# Patient Record
Sex: Male | Born: 1935 | Race: White | Hispanic: No | Marital: Married | Smoking: Never smoker
Health system: Southern US, Community
[De-identification: ages and names within clinical notes are randomized; demographics above are authoritative.]

## PROBLEM LIST (undated history)

## (undated) DIAGNOSIS — R0602 Shortness of breath: Secondary | ICD-10-CM

## (undated) DIAGNOSIS — I10 Essential (primary) hypertension: Secondary | ICD-10-CM

## (undated) DIAGNOSIS — R011 Cardiac murmur, unspecified: Secondary | ICD-10-CM

## (undated) HISTORY — PX: WISDOM TOOTH EXTRACTION: SHX21

## (undated) HISTORY — PX: TONSILLECTOMY: SUR1361

## (undated) HISTORY — PX: VASECTOMY: SHX75

## (undated) HISTORY — PX: EYE SURGERY: SHX253

---
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
## 2015-07-17 DIAGNOSIS — Z23 Encounter for immunization: Secondary | ICD-10-CM | POA: Diagnosis not present

## 2015-08-06 DIAGNOSIS — K5730 Diverticulosis of large intestine without perforation or abscess without bleeding: Secondary | ICD-10-CM | POA: Diagnosis not present

## 2015-08-06 DIAGNOSIS — I10 Essential (primary) hypertension: Secondary | ICD-10-CM | POA: Diagnosis not present

## 2015-08-06 DIAGNOSIS — H269 Unspecified cataract: Secondary | ICD-10-CM | POA: Diagnosis not present

## 2015-08-06 DIAGNOSIS — N401 Enlarged prostate with lower urinary tract symptoms: Secondary | ICD-10-CM | POA: Diagnosis not present

## 2015-08-06 DIAGNOSIS — R5383 Other fatigue: Secondary | ICD-10-CM | POA: Diagnosis not present

## 2015-08-06 DIAGNOSIS — E559 Vitamin D deficiency, unspecified: Secondary | ICD-10-CM | POA: Diagnosis not present

## 2015-08-06 DIAGNOSIS — E785 Hyperlipidemia, unspecified: Secondary | ICD-10-CM | POA: Diagnosis not present

## 2015-08-06 DIAGNOSIS — K649 Unspecified hemorrhoids: Secondary | ICD-10-CM | POA: Diagnosis not present

## 2015-08-06 DIAGNOSIS — Z1389 Encounter for screening for other disorder: Secondary | ICD-10-CM | POA: Diagnosis not present

## 2015-08-06 DIAGNOSIS — M545 Low back pain: Secondary | ICD-10-CM | POA: Diagnosis not present

## 2015-09-01 DIAGNOSIS — H353132 Nonexudative age-related macular degeneration, bilateral, intermediate dry stage: Secondary | ICD-10-CM | POA: Diagnosis not present

## 2015-10-16 DIAGNOSIS — M19071 Primary osteoarthritis, right ankle and foot: Secondary | ICD-10-CM | POA: Diagnosis not present

## 2015-10-16 DIAGNOSIS — R600 Localized edema: Secondary | ICD-10-CM | POA: Diagnosis not present

## 2015-10-16 DIAGNOSIS — M79671 Pain in right foot: Secondary | ICD-10-CM | POA: Diagnosis not present

## 2015-10-16 DIAGNOSIS — Z6830 Body mass index (BMI) 30.0-30.9, adult: Secondary | ICD-10-CM | POA: Diagnosis not present

## 2015-10-16 DIAGNOSIS — M7731 Calcaneal spur, right foot: Secondary | ICD-10-CM | POA: Diagnosis not present

## 2015-10-28 DIAGNOSIS — E875 Hyperkalemia: Secondary | ICD-10-CM | POA: Diagnosis not present

## 2015-10-28 DIAGNOSIS — Z0000 Encounter for general adult medical examination without abnormal findings: Secondary | ICD-10-CM | POA: Diagnosis not present

## 2015-10-28 DIAGNOSIS — I10 Essential (primary) hypertension: Secondary | ICD-10-CM | POA: Diagnosis not present

## 2015-10-28 DIAGNOSIS — E559 Vitamin D deficiency, unspecified: Secondary | ICD-10-CM | POA: Diagnosis not present

## 2015-10-28 DIAGNOSIS — E784 Other hyperlipidemia: Secondary | ICD-10-CM | POA: Diagnosis not present

## 2015-10-29 DIAGNOSIS — R69 Illness, unspecified: Secondary | ICD-10-CM | POA: Diagnosis not present

## 2015-11-03 DIAGNOSIS — M545 Low back pain: Secondary | ICD-10-CM | POA: Diagnosis not present

## 2015-11-03 DIAGNOSIS — Z0000 Encounter for general adult medical examination without abnormal findings: Secondary | ICD-10-CM | POA: Diagnosis not present

## 2015-11-03 DIAGNOSIS — M79671 Pain in right foot: Secondary | ICD-10-CM | POA: Diagnosis not present

## 2015-11-03 DIAGNOSIS — H268 Other specified cataract: Secondary | ICD-10-CM | POA: Diagnosis not present

## 2015-11-03 DIAGNOSIS — N183 Chronic kidney disease, stage 3 (moderate): Secondary | ICD-10-CM | POA: Diagnosis not present

## 2015-11-03 DIAGNOSIS — R972 Elevated prostate specific antigen [PSA]: Secondary | ICD-10-CM | POA: Diagnosis not present

## 2015-11-03 DIAGNOSIS — K5730 Diverticulosis of large intestine without perforation or abscess without bleeding: Secondary | ICD-10-CM | POA: Diagnosis not present

## 2015-11-03 DIAGNOSIS — R5383 Other fatigue: Secondary | ICD-10-CM | POA: Diagnosis not present

## 2015-11-03 DIAGNOSIS — E784 Other hyperlipidemia: Secondary | ICD-10-CM | POA: Diagnosis not present

## 2015-11-03 DIAGNOSIS — E559 Vitamin D deficiency, unspecified: Secondary | ICD-10-CM | POA: Diagnosis not present

## 2015-11-05 ENCOUNTER — Encounter: Payer: Self-pay | Admitting: Podiatry

## 2015-11-05 ENCOUNTER — Ambulatory Visit (INDEPENDENT_AMBULATORY_CARE_PROVIDER_SITE_OTHER): Payer: Medicare HMO

## 2015-11-05 ENCOUNTER — Ambulatory Visit (INDEPENDENT_AMBULATORY_CARE_PROVIDER_SITE_OTHER): Payer: Medicare HMO | Admitting: Podiatry

## 2015-11-05 DIAGNOSIS — M722 Plantar fascial fibromatosis: Secondary | ICD-10-CM | POA: Diagnosis not present

## 2015-11-05 MED ORDER — METHYLPREDNISOLONE 4 MG PO TBPK: ORAL_TABLET | ORAL | Status: DC

## 2015-11-05 MED ORDER — MELOXICAM 15 MG PO TABS: 15.0000 mg | ORAL_TABLET | Freq: Every day | ORAL | Status: DC

## 2015-11-05 NOTE — Progress Notes (Signed)
   Subjective:    Patient ID: Jonathan Becker, male    DOB: 1929/12/28, 82 y.o.   MRN: [id number]  HPI he presents today after several months of pain to the right heel. States that he has heel pain which is worse in the morning and after sitting for a while. He states when he gets back up to walk it is extremely painful. He's tried nothing to alleviate his symptoms.    Review of Systems  HENT: Positive for hearing loss.   All other systems reviewed and are negative.      Objective:   Physical Exam: Vital signs are stable he is alert and oriented 3. No acute distress. Pulses are strongly palpable. Neurologic sensorium is intact percent once the monofilament. Deep tendon reflexes intact bilateral and muscle strength is 5 over 5 dorsiflexion and plantar flexors and inverters and everters altered the musculature is intact. Orthopedic evaluation demonstrates all joints distal to the ankle have full range of motion without crepitus he has pain on palpation mucogingival the right heel. Radiographs evaluated from a disc that he brought in with him today does do a straight plantar distally oriented calcaneal heel spur with soft tissue increase in density at the plantar fascial calcaneal insertion site. Cutaneous evaluationof a well-hydrated cutis no erythema edema saline as drainage or odor.        Assessment & Plan:  Assessment: Plantar fasciitis right heel.  Plan: Injected the right heel today with Kenalog and local anesthetic. Start him on a Medrol Dosepak. Flow in the plantar fascial brace and a night splint. Discussed appropriate shoe gear stretching exercises and ice therapy will follow-up with him in 1 month.

## 2015-11-05 NOTE — Patient Instructions (Signed)

## 2015-11-06 ENCOUNTER — Telehealth: Payer: Self-pay

## 2015-11-06 NOTE — Telephone Encounter (Signed)
Spoke with pt regarding medication therapies. He had concerns with the side effects of meloxicam. He stated that his foot was feeling much better and that he intended to complete the prednisone as directed.Advised pt the meloxicam could be an option after completing the prednisone if he felt comfortable taking it. Continue with other therapies as directed by Dr Milinda Pointer. Pt is to call with any other questions or concerns

## 2015-11-19 DIAGNOSIS — D1801 Hemangioma of skin and subcutaneous tissue: Secondary | ICD-10-CM | POA: Diagnosis not present

## 2015-11-19 DIAGNOSIS — D225 Melanocytic nevi of trunk: Secondary | ICD-10-CM | POA: Diagnosis not present

## 2015-11-19 DIAGNOSIS — L820 Inflamed seborrheic keratosis: Secondary | ICD-10-CM | POA: Diagnosis not present

## 2015-11-19 DIAGNOSIS — L570 Actinic keratosis: Secondary | ICD-10-CM | POA: Diagnosis not present

## 2015-11-19 DIAGNOSIS — L821 Other seborrheic keratosis: Secondary | ICD-10-CM | POA: Diagnosis not present

## 2015-11-19 DIAGNOSIS — L814 Other melanin hyperpigmentation: Secondary | ICD-10-CM | POA: Diagnosis not present

## 2015-11-25 DIAGNOSIS — H25812 Combined forms of age-related cataract, left eye: Secondary | ICD-10-CM | POA: Diagnosis not present

## 2015-11-25 DIAGNOSIS — H2512 Age-related nuclear cataract, left eye: Secondary | ICD-10-CM | POA: Diagnosis not present

## 2015-12-03 ENCOUNTER — Encounter: Payer: Self-pay | Admitting: Podiatry

## 2015-12-03 ENCOUNTER — Ambulatory Visit (INDEPENDENT_AMBULATORY_CARE_PROVIDER_SITE_OTHER): Payer: Medicare HMO | Admitting: Podiatry

## 2015-12-03 VITALS — BP 150/75 | HR 64 | Resp 16

## 2015-12-03 DIAGNOSIS — M722 Plantar fascial fibromatosis: Secondary | ICD-10-CM | POA: Diagnosis not present

## 2015-12-03 NOTE — Progress Notes (Signed)
He presents today for follow-up of plantar fasciitis to the right foot. He states that the right heel is doing much better to the instep is a little bit sore.  Objective: Vital signs are stable he is alert and oriented 3. Pulses are strongly palpable. He has minimal pain on palpation medial calcaneal tubercle.  Assessment: Well-healing plantar fasciitis.  Plan: I encouraged him to continue all conservative therapies including anti-inflammatories myofascial brace and night splint. We discussed appropriate shoe via stretching exercises and ice therapy and continuation of his new shoes. Follow up with him in 1 month if necessary.

## 2015-12-16 DIAGNOSIS — H2511 Age-related nuclear cataract, right eye: Secondary | ICD-10-CM | POA: Diagnosis not present

## 2015-12-16 DIAGNOSIS — H25811 Combined forms of age-related cataract, right eye: Secondary | ICD-10-CM | POA: Diagnosis not present

## 2016-01-22 DIAGNOSIS — Z460 Encounter for fitting and adjustment of spectacles and contact lenses: Secondary | ICD-10-CM | POA: Diagnosis not present

## 2016-03-11 DIAGNOSIS — L720 Epidermal cyst: Secondary | ICD-10-CM | POA: Diagnosis not present

## 2016-03-11 DIAGNOSIS — L820 Inflamed seborrheic keratosis: Secondary | ICD-10-CM | POA: Diagnosis not present

## 2016-03-11 DIAGNOSIS — L728 Other follicular cysts of the skin and subcutaneous tissue: Secondary | ICD-10-CM | POA: Diagnosis not present

## 2016-03-11 DIAGNOSIS — L821 Other seborrheic keratosis: Secondary | ICD-10-CM | POA: Diagnosis not present

## 2016-05-03 DIAGNOSIS — R69 Illness, unspecified: Secondary | ICD-10-CM | POA: Diagnosis not present

## 2016-05-05 DIAGNOSIS — I129 Hypertensive chronic kidney disease with stage 1 through stage 4 chronic kidney disease, or unspecified chronic kidney disease: Secondary | ICD-10-CM | POA: Diagnosis not present

## 2016-05-05 DIAGNOSIS — R972 Elevated prostate specific antigen [PSA]: Secondary | ICD-10-CM | POA: Diagnosis not present

## 2016-05-05 DIAGNOSIS — Z6828 Body mass index (BMI) 28.0-28.9, adult: Secondary | ICD-10-CM | POA: Diagnosis not present

## 2016-05-05 DIAGNOSIS — N183 Chronic kidney disease, stage 3 (moderate): Secondary | ICD-10-CM | POA: Diagnosis not present

## 2016-06-24 DIAGNOSIS — Z23 Encounter for immunization: Secondary | ICD-10-CM | POA: Diagnosis not present

## 2016-07-06 DIAGNOSIS — R972 Elevated prostate specific antigen [PSA]: Secondary | ICD-10-CM | POA: Diagnosis not present

## 2016-07-26 DIAGNOSIS — Z961 Presence of intraocular lens: Secondary | ICD-10-CM | POA: Diagnosis not present

## 2016-09-08 DIAGNOSIS — L218 Other seborrheic dermatitis: Secondary | ICD-10-CM | POA: Diagnosis not present

## 2016-09-08 DIAGNOSIS — L821 Other seborrheic keratosis: Secondary | ICD-10-CM | POA: Diagnosis not present

## 2016-09-08 DIAGNOSIS — D225 Melanocytic nevi of trunk: Secondary | ICD-10-CM | POA: Diagnosis not present

## 2016-09-08 DIAGNOSIS — L814 Other melanin hyperpigmentation: Secondary | ICD-10-CM | POA: Diagnosis not present

## 2016-09-08 DIAGNOSIS — D1801 Hemangioma of skin and subcutaneous tissue: Secondary | ICD-10-CM | POA: Diagnosis not present

## 2016-09-08 DIAGNOSIS — L820 Inflamed seborrheic keratosis: Secondary | ICD-10-CM | POA: Diagnosis not present

## 2016-10-28 DIAGNOSIS — Z125 Encounter for screening for malignant neoplasm of prostate: Secondary | ICD-10-CM | POA: Diagnosis not present

## 2016-10-28 DIAGNOSIS — E559 Vitamin D deficiency, unspecified: Secondary | ICD-10-CM | POA: Diagnosis not present

## 2016-10-28 DIAGNOSIS — I10 Essential (primary) hypertension: Secondary | ICD-10-CM | POA: Diagnosis not present

## 2016-10-28 DIAGNOSIS — E784 Other hyperlipidemia: Secondary | ICD-10-CM | POA: Diagnosis not present

## 2016-11-04 DIAGNOSIS — I129 Hypertensive chronic kidney disease with stage 1 through stage 4 chronic kidney disease, or unspecified chronic kidney disease: Secondary | ICD-10-CM | POA: Diagnosis not present

## 2016-11-04 DIAGNOSIS — K5730 Diverticulosis of large intestine without perforation or abscess without bleeding: Secondary | ICD-10-CM | POA: Diagnosis not present

## 2016-11-04 DIAGNOSIS — M545 Low back pain: Secondary | ICD-10-CM | POA: Diagnosis not present

## 2016-11-04 DIAGNOSIS — Z0000 Encounter for general adult medical examination without abnormal findings: Secondary | ICD-10-CM | POA: Diagnosis not present

## 2016-11-04 DIAGNOSIS — E559 Vitamin D deficiency, unspecified: Secondary | ICD-10-CM | POA: Diagnosis not present

## 2016-11-04 DIAGNOSIS — E784 Other hyperlipidemia: Secondary | ICD-10-CM | POA: Diagnosis not present

## 2016-11-04 DIAGNOSIS — N401 Enlarged prostate with lower urinary tract symptoms: Secondary | ICD-10-CM | POA: Diagnosis not present

## 2016-11-04 DIAGNOSIS — R972 Elevated prostate specific antigen [PSA]: Secondary | ICD-10-CM | POA: Diagnosis not present

## 2016-11-04 DIAGNOSIS — K649 Unspecified hemorrhoids: Secondary | ICD-10-CM | POA: Diagnosis not present

## 2016-11-04 DIAGNOSIS — N183 Chronic kidney disease, stage 3 (moderate): Secondary | ICD-10-CM | POA: Diagnosis not present

## 2016-11-08 DIAGNOSIS — R69 Illness, unspecified: Secondary | ICD-10-CM | POA: Diagnosis not present

## 2016-11-16 DIAGNOSIS — Z1212 Encounter for screening for malignant neoplasm of rectum: Secondary | ICD-10-CM | POA: Diagnosis not present

## 2017-01-02 DIAGNOSIS — R69 Illness, unspecified: Secondary | ICD-10-CM | POA: Diagnosis not present

## 2017-01-27 DIAGNOSIS — Z961 Presence of intraocular lens: Secondary | ICD-10-CM | POA: Diagnosis not present

## 2017-01-27 DIAGNOSIS — H353132 Nonexudative age-related macular degeneration, bilateral, intermediate dry stage: Secondary | ICD-10-CM | POA: Diagnosis not present

## 2017-02-06 DIAGNOSIS — R972 Elevated prostate specific antigen [PSA]: Secondary | ICD-10-CM | POA: Diagnosis not present

## 2017-05-09 DIAGNOSIS — R69 Illness, unspecified: Secondary | ICD-10-CM | POA: Diagnosis not present

## 2017-06-06 DIAGNOSIS — D692 Other nonthrombocytopenic purpura: Secondary | ICD-10-CM | POA: Diagnosis not present

## 2017-06-06 DIAGNOSIS — I129 Hypertensive chronic kidney disease with stage 1 through stage 4 chronic kidney disease, or unspecified chronic kidney disease: Secondary | ICD-10-CM | POA: Diagnosis not present

## 2017-06-06 DIAGNOSIS — R972 Elevated prostate specific antigen [PSA]: Secondary | ICD-10-CM | POA: Diagnosis not present

## 2017-06-06 DIAGNOSIS — Z6829 Body mass index (BMI) 29.0-29.9, adult: Secondary | ICD-10-CM | POA: Diagnosis not present

## 2017-06-06 DIAGNOSIS — E784 Other hyperlipidemia: Secondary | ICD-10-CM | POA: Diagnosis not present

## 2017-06-06 DIAGNOSIS — H3530 Unspecified macular degeneration: Secondary | ICD-10-CM | POA: Diagnosis not present

## 2017-06-06 DIAGNOSIS — N183 Chronic kidney disease, stage 3 (moderate): Secondary | ICD-10-CM | POA: Diagnosis not present

## 2017-07-10 DIAGNOSIS — R69 Illness, unspecified: Secondary | ICD-10-CM | POA: Diagnosis not present

## 2017-07-31 DIAGNOSIS — R972 Elevated prostate specific antigen [PSA]: Secondary | ICD-10-CM | POA: Diagnosis not present

## 2017-08-07 DIAGNOSIS — R972 Elevated prostate specific antigen [PSA]: Secondary | ICD-10-CM | POA: Diagnosis not present

## 2017-08-08 DIAGNOSIS — L570 Actinic keratosis: Secondary | ICD-10-CM | POA: Diagnosis not present

## 2017-08-08 DIAGNOSIS — D1801 Hemangioma of skin and subcutaneous tissue: Secondary | ICD-10-CM | POA: Diagnosis not present

## 2017-08-08 DIAGNOSIS — D692 Other nonthrombocytopenic purpura: Secondary | ICD-10-CM | POA: Diagnosis not present

## 2017-08-08 DIAGNOSIS — D225 Melanocytic nevi of trunk: Secondary | ICD-10-CM | POA: Diagnosis not present

## 2017-08-08 DIAGNOSIS — L218 Other seborrheic dermatitis: Secondary | ICD-10-CM | POA: Diagnosis not present

## 2017-08-08 DIAGNOSIS — D2262 Melanocytic nevi of left upper limb, including shoulder: Secondary | ICD-10-CM | POA: Diagnosis not present

## 2017-08-08 DIAGNOSIS — L820 Inflamed seborrheic keratosis: Secondary | ICD-10-CM | POA: Diagnosis not present

## 2017-08-08 DIAGNOSIS — L821 Other seborrheic keratosis: Secondary | ICD-10-CM | POA: Diagnosis not present

## 2017-10-30 DIAGNOSIS — E559 Vitamin D deficiency, unspecified: Secondary | ICD-10-CM | POA: Diagnosis not present

## 2017-10-30 DIAGNOSIS — R82998 Other abnormal findings in urine: Secondary | ICD-10-CM | POA: Diagnosis not present

## 2017-10-30 DIAGNOSIS — Z125 Encounter for screening for malignant neoplasm of prostate: Secondary | ICD-10-CM | POA: Diagnosis not present

## 2017-10-30 DIAGNOSIS — I10 Essential (primary) hypertension: Secondary | ICD-10-CM | POA: Diagnosis not present

## 2017-11-06 DIAGNOSIS — E7849 Other hyperlipidemia: Secondary | ICD-10-CM | POA: Diagnosis not present

## 2017-11-06 DIAGNOSIS — R972 Elevated prostate specific antigen [PSA]: Secondary | ICD-10-CM | POA: Diagnosis not present

## 2017-11-06 DIAGNOSIS — N183 Chronic kidney disease, stage 3 (moderate): Secondary | ICD-10-CM | POA: Diagnosis not present

## 2017-11-06 DIAGNOSIS — H3530 Unspecified macular degeneration: Secondary | ICD-10-CM | POA: Diagnosis not present

## 2017-11-06 DIAGNOSIS — R001 Bradycardia, unspecified: Secondary | ICD-10-CM | POA: Diagnosis not present

## 2017-11-06 DIAGNOSIS — E559 Vitamin D deficiency, unspecified: Secondary | ICD-10-CM | POA: Diagnosis not present

## 2017-11-06 DIAGNOSIS — I129 Hypertensive chronic kidney disease with stage 1 through stage 4 chronic kidney disease, or unspecified chronic kidney disease: Secondary | ICD-10-CM | POA: Diagnosis not present

## 2017-11-06 DIAGNOSIS — K429 Umbilical hernia without obstruction or gangrene: Secondary | ICD-10-CM | POA: Diagnosis not present

## 2017-11-06 DIAGNOSIS — M545 Low back pain: Secondary | ICD-10-CM | POA: Diagnosis not present

## 2017-11-06 DIAGNOSIS — Z0000 Encounter for general adult medical examination without abnormal findings: Secondary | ICD-10-CM | POA: Diagnosis not present

## 2017-11-13 DIAGNOSIS — Z1212 Encounter for screening for malignant neoplasm of rectum: Secondary | ICD-10-CM | POA: Diagnosis not present

## 2017-11-14 DIAGNOSIS — R69 Illness, unspecified: Secondary | ICD-10-CM | POA: Diagnosis not present

## 2017-11-17 ENCOUNTER — Encounter (HOSPITAL_COMMUNITY): Payer: Self-pay | Admitting: *Deleted

## 2017-11-17 ENCOUNTER — Other Ambulatory Visit: Payer: Self-pay

## 2017-11-17 ENCOUNTER — Ambulatory Visit: Payer: Self-pay | Admitting: Surgery

## 2017-11-17 DIAGNOSIS — K429 Umbilical hernia without obstruction or gangrene: Secondary | ICD-10-CM | POA: Diagnosis not present

## 2017-11-17 NOTE — H&P (Signed)
Jonathan Becker Documented: 11/17/2017 2:04 PM Location: [facility] Patient #: [id number] DOB: Dec 08, 1929 Married / Language: Cleophus Molt / Race: White Male   History of Present Illness (Kenzley Ke A. Kae Heller MD; 11/17/2017 2:32 PM) Patient words: This is a very pleasant and relatively healthy 84 year old gentleman who noticed a bulge at his umbilicus a few months ago. He does have a pinching type pain which is worse after eating meals but other than that it does not really bother him. It is reducible. He's never had any prior abdominal surgery. He is interested in having the hernia repaired. No nausea or vomiting, no abdominal distention or constipation. He and his wife of 50yrs are going on a trip starting in the middle of March where they'll be gone for 4-5 weeks on a cruise from [place] thru [place] to [place], and he is hoping to have surgery done early enough that he will not have to worry about lifting his luggage.  H.o HTN, CKD III, macular degeneration, vit d deficiency, chronic back pain, sinus bradycardia, BPH. He has not been in a hospital since having his tonsils out at age 35.  Past Surgical History (Tanisha A. Owens Shark, [place]; 11/17/2017 2:06 PM) Cataract Surgery  Bilateral. Colon Polyp Removal - Colonoscopy  Oral Surgery  Tonsillectomy  Vasectomy   Diagnostic Studies History (Tanisha A. Owens Shark, [place]; 11/17/2017 2:06 PM) Colonoscopy  within last year  Allergies (Tanisha A. Owens Shark, [place]; 11/17/2017 2:08 PM) Sulfa Antibiotics  Clindamycin  Allergies Reconciled   Medication History (Tanisha A. Owens Shark, [place]; 11/17/2017 2:08 PM) AmLODIPine Besylate (10MG  Tablet, Oral) Active. Irbesartan (150MG  Tablet, Oral) Active. Aspirin (81MG  Tablet, Oral) Active. Medications Reconciled  Social History (Tanisha A. Owens Shark, [place]; 11/17/2017 2:07 PM) Alcohol use  Moderate alcohol use. Caffeine use  Coffee. No drug use  Tobacco use  Former smoker.  Family History  (Tanisha A. Owens Shark, [place]; 11/17/2017 2:07 PM) Diabetes Mellitus  Father. Heart Disease  Father. Seizure disorder  Sister.  Other Problems (Tanisha A. Owens Shark, [place]; 11/17/2017 2:07 PM) Back Pain  Enlarged Prostate  High blood pressure     Review of Systems (Tanisha A. Brown RMA; 11/17/2017 2:07 PM) General Not Present- Appetite Loss, Chills, Fatigue, Fever, Night Sweats, Weight Gain and Weight Loss. Skin Not Present- Change in Wart/Mole, Dryness, Hives, Jaundice, New Lesions, Non-Healing Wounds, Rash and Ulcer. HEENT Present- Hearing Loss and Wears glasses/contact lenses. Not Present- Earache, Hoarseness, Nose Bleed, Oral Ulcers, Ringing in the Ears, Seasonal Allergies, Sinus Pain, Sore Throat, Visual Disturbances and Yellow Eyes. Respiratory Not Present- Bloody sputum, Chronic Cough, Difficulty Breathing, Snoring and Wheezing. Breast Not Present- Breast Mass, Breast Pain, Nipple Discharge and Skin Changes. Cardiovascular Not Present- Chest Pain, Difficulty Breathing Lying Down, Leg Cramps, Palpitations, Rapid Heart Rate, Shortness of Breath and Swelling of Extremities. Gastrointestinal Present- Abdominal Pain. Not Present- Bloating, Bloody Stool, Change in Bowel Habits, Chronic diarrhea, Constipation, Difficulty Swallowing, Excessive gas, Gets full quickly at meals, Hemorrhoids, Indigestion, Nausea, Rectal Pain and Vomiting. Male Genitourinary Present- Urgency and Urine Leakage. Not Present- Blood in Urine, Change in Urinary Stream, Frequency, Impotence, Nocturia and Painful Urination. Musculoskeletal Present- Back Pain. Not Present- Joint Pain, Joint Stiffness, Muscle Pain, Muscle Weakness and Swelling of Extremities. Neurological Not Present- Decreased Memory, Fainting, Headaches, Numbness, Seizures, Tingling, Tremor, Trouble walking and Weakness. Psychiatric Not Present- Anxiety, Bipolar, Change in Sleep Pattern, Depression, Fearful and Frequent crying. Endocrine Not Present- Cold Intolerance,  Excessive Hunger, Hair Changes, Heat Intolerance, Hot flashes and New Diabetes. Hematology Not  Present- Blood Thinners, Easy Bruising, Excessive bleeding, Gland problems, HIV and Persistent Infections.  Vitals (Tanisha A. Brown RMA; 11/17/2017 2:08 PM) 11/17/2017 2:07 PM Weight: 195.6 lb Height: 69in Body Surface Area: 2.05 m Body Mass Index: 28.88 kg/m  Temp.: 98.85F  Pulse: 96 (Regular)  BP: 132/84 (Sitting, Left Arm, Standard)       Physical Exam (Jatinder Mcdonagh A. Kae Heller MD; 11/17/2017 2:33 PM) General Note: alert and well appearing   Integumentary Note: warm and dry   Head and Neck Note: no mass or thyromegaly   Eye Note: No scleral icterus. Extra ocular motions intact.   ENMT Note: Moist mucous membranes, dentition intact   Chest and Lung Exam Note: Unlabored respirations, clear bilaterally   Cardiovascular Note: Regular rate and rhythm, no pedal edema   Abdomen Note: Soft, nontender nondistended. Reducible umbilical hernia, fascial defect is not easily palpable. There is a broad midline diastases in which the hernia sits. No mass or organomegaly.   Neurologic Note: Grossly intact, normal gait   Neuropsychiatric Note: Normal mood and affect. Appropriate insight.   Musculoskeletal Note: Strength symmetrical throughout, no deformity     Assessment & Plan (Kesi Perrow A. Kentarius Partington MD; 06/15/7590 6:38 PM) UMBILICAL HERNIA (G66.5) Story: I recommend open repair possibly with use of mesh depending on the size of the defect at the time of surgery. We discussed the technique of the operation as well as the typical postoperative recovery including no heavy lifting above 20 pounds for about 6 weeks. Discussed risk of bleeding, infection, pain, scarring, injury to intra-abdominal structures and hernia recurrence. We also discussed the general risks given his age of undergoing anesthesia including heart attack, pneumonia, blood clots, stroke and death.  Questions were welcomed and answered. We will try to get him scheduled as soon as possible.  Signed electronically by Clovis Riley, MD (11/17/2017 2:34 PM)

## 2017-11-17 NOTE — H&P (View-Only) (Signed)
Jonathan Becker Documented: 11/17/2017 2:04 PM Location: [facility] Patient #: [id number] DOB: 08-28-1930 Married / Language: Cleophus Molt / Race: White Male   History of Present Illness (Kalab Camps A. Kae Heller MD; 11/17/2017 2:32 PM) Patient words: This is a very pleasant and relatively healthy 84 year old gentleman who noticed a bulge at his umbilicus a few months ago. He does have a pinching type pain which is worse after eating meals but other than that it does not really bother him. It is reducible. He's never had any prior abdominal surgery. He is interested in having the hernia repaired. No nausea or vomiting, no abdominal distention or constipation. He and his wife of 78yrs are going on a trip starting in the middle of March where they'll be gone for 4-5 weeks on a cruise from [place] to [place], and he is hoping to have surgery done early enough that he will not have to worry about lifting his luggage.  H.o HTN, CKD III, macular degeneration, vit d deficiency, chronic back pain, sinus bradycardia, BPH. He has not been in a hospital since having his tonsils out at age 68.  Past Surgical History (Tanisha A. Owens Shark, [place]; 11/17/2017 2:06 PM) Cataract Surgery  Bilateral. Colon Polyp Removal - Colonoscopy  Oral Surgery  Tonsillectomy  Vasectomy   Diagnostic Studies History (Tanisha A. Owens Shark, [place]; 11/17/2017 2:06 PM) Colonoscopy  within last year  Allergies (Tanisha A. Owens Shark, [place]; 11/17/2017 2:08 PM) Sulfa Antibiotics  Clindamycin  Allergies Reconciled   Medication History (Tanisha A. Owens Shark, [place]; 11/17/2017 2:08 PM) AmLODIPine Besylate (10MG  Tablet, Oral) Active. Irbesartan (150MG  Tablet, Oral) Active. Aspirin (81MG  Tablet, Oral) Active. Medications Reconciled  Social History (Tanisha A. Owens Shark, [place]; 11/17/2017 2:07 PM) Alcohol use  Moderate alcohol use. Caffeine use  Coffee. No drug use  Tobacco use  Former smoker.  Family History  (Tanisha A. Owens Shark, [place]; 11/17/2017 2:07 PM) Diabetes Mellitus  Father. Heart Disease  Father. Seizure disorder  Sister.  Other Problems (Tanisha A. Owens Shark, [place]; 11/17/2017 2:07 PM) Back Pain  Enlarged Prostate  High blood pressure     Review of Systems (Tanisha A. Brown RMA; 11/17/2017 2:07 PM) General Not Present- Appetite Loss, Chills, Fatigue, Fever, Night Sweats, Weight Gain and Weight Loss. Skin Not Present- Change in Wart/Mole, Dryness, Hives, Jaundice, New Lesions, Non-Healing Wounds, Rash and Ulcer. HEENT Present- Hearing Loss and Wears glasses/contact lenses. Not Present- Earache, Hoarseness, Nose Bleed, Oral Ulcers, Ringing in the Ears, Seasonal Allergies, Sinus Pain, Sore Throat, Visual Disturbances and Yellow Eyes. Respiratory Not Present- Bloody sputum, Chronic Cough, Difficulty Breathing, Snoring and Wheezing. Breast Not Present- Breast Mass, Breast Pain, Nipple Discharge and Skin Changes. Cardiovascular Not Present- Chest Pain, Difficulty Breathing Lying Down, Leg Cramps, Palpitations, Rapid Heart Rate, Shortness of Breath and Swelling of Extremities. Gastrointestinal Present- Abdominal Pain. Not Present- Bloating, Bloody Stool, Change in Bowel Habits, Chronic diarrhea, Constipation, Difficulty Swallowing, Excessive gas, Gets full quickly at meals, Hemorrhoids, Indigestion, Nausea, Rectal Pain and Vomiting. Male Genitourinary Present- Urgency and Urine Leakage. Not Present- Blood in Urine, Change in Urinary Stream, Frequency, Impotence, Nocturia and Painful Urination. Musculoskeletal Present- Back Pain. Not Present- Joint Pain, Joint Stiffness, Muscle Pain, Muscle Weakness and Swelling of Extremities. Neurological Not Present- Decreased Memory, Fainting, Headaches, Numbness, Seizures, Tingling, Tremor, Trouble walking and Weakness. Psychiatric Not Present- Anxiety, Bipolar, Change in Sleep Pattern, Depression, Fearful and Frequent crying. Endocrine Not Present- Cold Intolerance,  Excessive Hunger, Hair Changes, Heat Intolerance, Hot flashes and New Diabetes. Hematology Not  Present- Blood Thinners, Easy Bruising, Excessive bleeding, Gland problems, HIV and Persistent Infections.  Vitals (Tanisha A. Brown RMA; 11/17/2017 2:08 PM) 11/17/2017 2:07 PM Weight: 195.6 lb Height: 69in Body Surface Area: 2.05 m Body Mass Index: 28.88 kg/m  Temp.: 98.84F  Pulse: 96 (Regular)  BP: 132/84 (Sitting, Left Arm, Standard)       Physical Exam (Kniyah Khun A. Kae Heller MD; 11/17/2017 2:33 PM) General Note: alert and well appearing   Integumentary Note: warm and dry   Head and Neck Note: no mass or thyromegaly   Eye Note: No scleral icterus. Extra ocular motions intact.   ENMT Note: Moist mucous membranes, dentition intact   Chest and Lung Exam Note: Unlabored respirations, clear bilaterally   Cardiovascular Note: Regular rate and rhythm, no pedal edema   Abdomen Note: Soft, nontender nondistended. Reducible umbilical hernia, fascial defect is not easily palpable. There is a broad midline diastases in which the hernia sits. No mass or organomegaly.   Neurologic Note: Grossly intact, normal gait   Neuropsychiatric Note: Normal mood and affect. Appropriate insight.   Musculoskeletal Note: Strength symmetrical throughout, no deformity     Assessment & Plan (Hershey Knauer A. Karsynn Deweese MD; 05/13/4195 2:22 PM) UMBILICAL HERNIA (L79.8) Story: I recommend open repair possibly with use of mesh depending on the size of the defect at the time of surgery. We discussed the technique of the operation as well as the typical postoperative recovery including no heavy lifting above 20 pounds for about 6 weeks. Discussed risk of bleeding, infection, pain, scarring, injury to intra-abdominal structures and hernia recurrence. We also discussed the general risks given his age of undergoing anesthesia including heart attack, pneumonia, blood clots, stroke and death.  Questions were welcomed and answered. We will try to get him scheduled as soon as possible.  Signed electronically by Clovis Riley, MD (11/17/2017 2:34 PM)

## 2017-11-20 ENCOUNTER — Ambulatory Visit (HOSPITAL_COMMUNITY): Payer: Medicare HMO | Admitting: Certified Registered Nurse Anesthetist

## 2017-11-20 ENCOUNTER — Ambulatory Visit (HOSPITAL_COMMUNITY)
Admission: RE | Admit: 2017-11-20 | Discharge: 2017-11-20 | Disposition: A | Discharge status: Home / Self Care | Payer: Medicare HMO | Source: Ambulatory Visit | Attending: Surgery | Admitting: Surgery

## 2017-11-20 ENCOUNTER — Encounter (HOSPITAL_COMMUNITY)
Admission: RE | Disposition: A | Discharge status: Home / Self Care | Payer: Self-pay | Source: Ambulatory Visit | Attending: Surgery

## 2017-11-20 ENCOUNTER — Other Ambulatory Visit: Payer: Self-pay

## 2017-11-20 ENCOUNTER — Encounter (HOSPITAL_COMMUNITY): Payer: Self-pay | Admitting: *Deleted

## 2017-11-20 DIAGNOSIS — K429 Umbilical hernia without obstruction or gangrene: Secondary | ICD-10-CM | POA: Insufficient documentation

## 2017-11-20 HISTORY — DX: Essential (primary) hypertension: I10

## 2017-11-20 HISTORY — DX: Cardiac murmur, unspecified: R01.1

## 2017-11-20 HISTORY — PX: UMBILICAL HERNIA REPAIR: SHX196

## 2017-11-20 LAB — BASIC METABOLIC PANEL
ANION GAP: 9 (ref 5–15)
BUN: 20 mg/dL (ref 6–20)
CALCIUM: 8.8 mg/dL — AB (ref 8.9–10.3)
CHLORIDE: 106 mmol/L (ref 101–111)
CO2: 24 mmol/L (ref 22–32)
Creatinine, Ser: 0.98 mg/dL (ref 0.61–1.24)
GFR calc Af Amer: >60 mL/min (ref >60)
GFR calc non Af Amer: >60 mL/min (ref >60)
GLUCOSE: 106 mg/dL — AB (ref 65–99)
POTASSIUM: 4.1 mmol/L (ref 3.5–5.1)
Sodium: 139 mmol/L (ref 135–145)

## 2017-11-20 LAB — CBC WITH DIFFERENTIAL/PLATELET
Basophils Absolute: 0 10*3/uL (ref 0.0–0.1)
Basophils Relative: 0 %
Eosinophils Absolute: 0.2 10*3/uL (ref 0.0–0.7)
Eosinophils Relative: 3 %
HEMATOCRIT: 42.8 % (ref 39.0–52.0)
HEMOGLOBIN: 15.1 g/dL (ref 13.0–17.0)
LYMPHS ABS: 2.5 10*3/uL (ref 0.7–4.0)
LYMPHS PCT: 39 %
MCH: 33.4 pg (ref 26.0–34.0)
MCHC: 35.3 g/dL (ref 30.0–36.0)
MCV: 94.7 fL (ref 78.0–100.0)
MONOS PCT: 9 %
Monocytes Absolute: 0.6 10*3/uL (ref 0.1–1.0)
NEUTROS ABS: 3.2 10*3/uL (ref 1.7–7.7)
Neutrophils Relative %: 49 %
Platelets: 261 10*3/uL (ref 150–400)
RBC: 4.52 MIL/uL (ref 4.22–5.81)
RDW: 13.2 % (ref 11.5–15.5)
WBC: 6.5 10*3/uL (ref 4.0–10.5)

## 2017-11-20 SURGERY — REPAIR, HERNIA, UMBILICAL, ADULT
Anesthesia: General

## 2017-11-20 MED ORDER — CHLORHEXIDINE GLUCONATE 4 % EX LIQD: 60.0000 mL | Freq: Once | CUTANEOUS | Status: DC

## 2017-11-20 MED ORDER — LIDOCAINE 2% (20 MG/ML) 5 ML SYRINGE
INTRAMUSCULAR | Status: AC
  Filled 2017-11-20: qty 5

## 2017-11-20 MED ORDER — BUPIVACAINE-EPINEPHRINE 0.25% -1:200000 IJ SOLN
INTRAMUSCULAR | Status: AC
  Filled 2017-11-20: qty 1

## 2017-11-20 MED ORDER — GABAPENTIN 300 MG PO CAPS
300.0000 mg | ORAL_CAPSULE | ORAL | Status: AC
  Administered 2017-11-20: 300 mg via ORAL
  Filled 2017-11-20: qty 1

## 2017-11-20 MED ORDER — BUPIVACAINE-EPINEPHRINE 0.25% -1:200000 IJ SOLN
INTRAMUSCULAR | Status: DC | PRN
  Administered 2017-11-20: 25 mL

## 2017-11-20 MED ORDER — EPHEDRINE SULFATE 50 MG/ML IJ SOLN
INTRAMUSCULAR | Status: DC | PRN
  Administered 2017-11-20 (×2): 10 mg via INTRAVENOUS
  Administered 2017-11-20: 5 mg via INTRAVENOUS
  Administered 2017-11-20 (×2): 10 mg via INTRAVENOUS
  Administered 2017-11-20: 5 mg via INTRAVENOUS

## 2017-11-20 MED ORDER — OXYCODONE HCL 5 MG/5ML PO SOLN
5.0000 mg | Freq: Once | ORAL | Status: DC | PRN
  Filled 2017-11-20: qty 5

## 2017-11-20 MED ORDER — LACTATED RINGERS IV SOLN
INTRAVENOUS | Status: DC
  Administered 2017-11-20: 09:00:00 via INTRAVENOUS

## 2017-11-20 MED ORDER — FENTANYL CITRATE (PF) 100 MCG/2ML IJ SOLN
INTRAMUSCULAR | Status: DC | PRN
  Administered 2017-11-20 (×4): 25 ug via INTRAVENOUS

## 2017-11-20 MED ORDER — PROMETHAZINE HCL 25 MG/ML IJ SOLN: 6.2500 mg | INTRAMUSCULAR | Status: DC | PRN

## 2017-11-20 MED ORDER — HYDROCODONE-ACETAMINOPHEN 5-325 MG PO TABS: 1.0000 | ORAL_TABLET | Freq: Four times a day (QID) | ORAL | 0 refills | Status: DC | PRN

## 2017-11-20 MED ORDER — ONDANSETRON HCL 4 MG/2ML IJ SOLN
INTRAMUSCULAR | Status: AC
  Filled 2017-11-20: qty 2

## 2017-11-20 MED ORDER — LIDOCAINE 2% (20 MG/ML) 5 ML SYRINGE
INTRAMUSCULAR | Status: DC | PRN
  Administered 2017-11-20: 80 mg via INTRAVENOUS

## 2017-11-20 MED ORDER — FENTANYL CITRATE (PF) 100 MCG/2ML IJ SOLN
INTRAMUSCULAR | Status: AC
  Filled 2017-11-20: qty 2

## 2017-11-20 MED ORDER — PROPOFOL 10 MG/ML IV BOLUS
INTRAVENOUS | Status: DC | PRN
  Administered 2017-11-20: 200 mg via INTRAVENOUS

## 2017-11-20 MED ORDER — FENTANYL CITRATE (PF) 100 MCG/2ML IJ SOLN: 25.0000 ug | INTRAMUSCULAR | Status: DC | PRN

## 2017-11-20 MED ORDER — CEFAZOLIN SODIUM-DEXTROSE 2-4 GM/100ML-% IV SOLN
2.0000 g | INTRAVENOUS | Status: AC
  Administered 2017-11-20: 2 g via INTRAVENOUS
  Filled 2017-11-20: qty 100

## 2017-11-20 MED ORDER — ONDANSETRON HCL 4 MG/2ML IJ SOLN
INTRAMUSCULAR | Status: DC | PRN
  Administered 2017-11-20: 4 mg via INTRAVENOUS

## 2017-11-20 MED ORDER — ACETAMINOPHEN 500 MG PO TABS
1000.0000 mg | ORAL_TABLET | ORAL | Status: AC
  Administered 2017-11-20: 1000 mg via ORAL
  Filled 2017-11-20: qty 2

## 2017-11-20 MED ORDER — EPHEDRINE 5 MG/ML INJ
INTRAVENOUS | Status: AC
  Filled 2017-11-20: qty 10

## 2017-11-20 MED ORDER — PROPOFOL 10 MG/ML IV BOLUS
INTRAVENOUS | Status: AC
  Filled 2017-11-20: qty 20

## 2017-11-20 MED ORDER — OXYCODONE HCL 5 MG PO TABS: 5.0000 mg | ORAL_TABLET | Freq: Once | ORAL | Status: DC | PRN

## 2017-11-20 MED ORDER — 0.9 % SODIUM CHLORIDE (POUR BTL) OPTIME
TOPICAL | Status: DC | PRN
  Administered 2017-11-20: 1000 mL

## 2017-11-20 MED ORDER — DOCUSATE SODIUM 100 MG PO CAPS: 100.0000 mg | ORAL_CAPSULE | Freq: Two times a day (BID) | ORAL | 0 refills | Status: AC

## 2017-11-20 SURGICAL SUPPLY — 25 items
BENZOIN TINCTURE PRP APPL 2/3 (GAUZE/BANDAGES/DRESSINGS) IMPLANT
CHLORAPREP W/TINT 26ML (MISCELLANEOUS) ×2 IMPLANT
COVER SURGICAL LIGHT HANDLE (MISCELLANEOUS) ×2 IMPLANT
DECANTER SPIKE VIAL GLASS SM (MISCELLANEOUS) ×2 IMPLANT
DERMABOND ADVANCED (GAUZE/BANDAGES/DRESSINGS) ×1
DERMABOND ADVANCED .7 DNX12 (GAUZE/BANDAGES/DRESSINGS) ×1 IMPLANT
DRAPE LAPAROSCOPIC ABDOMINAL (DRAPES) ×2 IMPLANT
ELECT REM PT RETURN 15FT ADLT (MISCELLANEOUS) ×2 IMPLANT
GAUZE SPONGE 4X4 12PLY STRL (GAUZE/BANDAGES/DRESSINGS) IMPLANT
GLOVE BIO SURGEON STRL SZ 6 (GLOVE) ×2 IMPLANT
GLOVE INDICATOR 6.5 STRL GRN (GLOVE) ×2 IMPLANT
GOWN STRL REUS W/TWL LRG LVL3 (GOWN DISPOSABLE) ×2 IMPLANT
GOWN STRL REUS W/TWL XL LVL3 (GOWN DISPOSABLE) ×2 IMPLANT
KIT BASIN OR (CUSTOM PROCEDURE TRAY) ×2 IMPLANT
NEEDLE HYPO 22GX1.5 SAFETY (NEEDLE) ×2 IMPLANT
PACK GENERAL/GYN (CUSTOM PROCEDURE TRAY) ×2 IMPLANT
STRIP CLOSURE SKIN 1/2X4 (GAUZE/BANDAGES/DRESSINGS) IMPLANT
SUT ETHIBOND 0 MO6 C/R (SUTURE) ×2 IMPLANT
SUT MNCRL AB 4-0 PS2 18 (SUTURE) ×2 IMPLANT
SUT PROLENE 2 0 CT2 30 (SUTURE) ×4 IMPLANT
SUT VIC AB 3-0 SH 27 (SUTURE)
SUT VIC AB 3-0 SH 27XBRD (SUTURE) IMPLANT
SYR CONTROL 10ML LL (SYRINGE) ×2 IMPLANT
TOWEL OR 17X26 10 PK STRL BLUE (TOWEL DISPOSABLE) ×2 IMPLANT
TOWEL OR NON WOVEN STRL DISP B (DISPOSABLE) ×2 IMPLANT

## 2017-11-20 NOTE — Discharge Instructions (Signed)
HERNIA REPAIR: POST OP INSTRUCTIONS  ######################################################################  EAT Gradually transition to a high fiber diet with a fiber supplement over the next few weeks after discharge.  Start with a pureed / full liquid diet (see below)  WALK Walk an hour a day.  Control your pain to do that.    CONTROL PAIN Control pain so that you can walk, sleep, tolerate sneezing/coughing, go up/down stairs.  HAVE A BOWEL MOVEMENT DAILY Keep your bowels regular to avoid problems.  OK to try a laxative to override constipation.  OK to use an antidairrheal to slow down diarrhea.  Call if not better after 2 tries  CALL IF YOU HAVE PROBLEMS/CONCERNS Call if you are still struggling despite following these instructions. Call if you have concerns not answered by these instructions  ######################################################################    1. DIET: Follow a light bland diet the first 24 hours after arrival home, such as soup, liquids, crackers, etc.  Be sure to include lots of fluids daily.  Avoid fast food or heavy meals as your are more likely to get nauseated.  Eat a low fat the next few days after surgery. 2. Take your usually prescribed home medications unless otherwise directed. 3. PAIN CONTROL: a. Pain is best controlled by a usual combination of three different methods TOGETHER: i. Ice/Heat ii. Over the counter pain medication iii. Prescription pain medication b. Most patients will experience some swelling and bruising around the hernia(s) such as the bellybutton, groins, or old incisions.  Ice packs or heating pads (30-60 minutes up to 6 times a day) will help. Use ice for the first few days to help decrease swelling and bruising, then switch to heat to help relax tight/sore spots and speed recovery.  Some people prefer to use ice alone, heat alone, alternating between ice & heat.  Experiment to what works for you.  Swelling and bruising can take  several weeks to resolve.   c. It is helpful to take an over-the-counter pain medication regularly for the first few weeks.  Choose one of the following that works best for you: i. Naproxen (Aleve, etc)  Two 220mg  tabs twice a day ii. Ibuprofen (Advil, etc) Three 200mg  tabs four times a day (every meal & bedtime) iii. Acetaminophen (Tylenol, etc) 325-650mg  four times a day (every meal & bedtime) d. A  prescription for pain medication should be given to you upon discharge.  Take your pain medication as prescribed.  i. If you are having problems/concerns with the prescription medicine (does not control pain, nausea, vomiting, rash, itching, etc), please call us [phone] to see if we need to switch you to a different pain medicine that will work better for you and/or control your side effect better. ii. If you need a refill on your pain medication, please contact your pharmacy.  They will contact our office to request authorization. Prescriptions will not be filled after 5 pm or on week-ends. 4. Avoid getting constipated.  Between the surgery and the pain medications, it is common to experience some constipation.  Increasing fluid intake and taking a fiber supplement (such as Metamucil, Citrucel, FiberCon, MiraLax, etc) 1-2 times a day regularly will usually help prevent this problem from occurring.  A mild laxative (prune juice, Milk of Magnesia, MiraLax, etc) should be taken according to package directions if there are no bowel movements after 48 hours.   5. Wash / shower every day.  You may shower over the skin glue which is waterproof.  No soaking or  swimming for 2 weeks 6. Skin glue will peel off after a couple weeks.  You may leave the incision open to air.  You may replace a dressing/Band-Aid to cover the incision for comfort if you wish.  Continue to shower over incision(s) after the dressing is off.    7. ACTIVITIES as tolerated:   a. You may resume regular (light) daily activities  beginning the next day--such as daily self-care, walking, climbing stairs--gradually increasing activities as tolerated.  If you can walk 30 minutes without difficulty, it is safe to try more intense activity such as jogging, treadmill, bicycling, low-impact aerobics, swimming, etc. b. Save the most intensive and strenuous activity for last such as sit-ups, heavy lifting, contact sports, etc  Refrain from any heavy lifting or straining until 6 weeks after surgery.   c. DO NOT PUSH THROUGH PAIN.  Let pain be your guide: If it hurts to do something, don't do it.  Pain is your body warning you to avoid that activity for another week until the pain goes down. d. You may drive when you are no longer taking prescription pain medication, you can comfortably wear a seatbelt, and you can safely maneuver your car and apply brakes. e. Dennis Bast may have sexual intercourse when it is comfortable.  8. FOLLOW UP in our office a. Please call [facility] at [phone] to set up an appointment to see your surgeon in the office for a follow-up appointment approximately 2-3 weeks after your surgery. b. Make sure that you call for this appointment the day you arrive home to insure a convenient appointment time. 9.  IF YOU HAVE DISABILITY OR FAMILY LEAVE FORMS, BRING THEM TO THE OFFICE FOR PROCESSING.  DO NOT GIVE THEM TO YOUR DOCTOR.  WHEN TO CALL us [phone]: 1. Poor pain control 2. Reactions / problems with new medications (rash/itching, nausea, etc)  3. Fever over 101.5 F (38.5 C) 4. Inability to urinate 5. Nausea and/or vomiting 6. Worsening swelling or bruising 7. Continued bleeding from incision. 8. Increased pain, redness, or drainage from the incision   The clinic staff is available to answer your questions during regular business hours (8:30am-5pm).  Please dont hesitate to call and ask to speak to one of our nurses for clinical concerns.   If you have a medical emergency, go to the nearest emergency room or  call 911.  A surgeon from [facility] is always on call at the hospitals in [facility], [place], [place], [place], [place]  [address] ?  [address] [address], [place], [place]   [address] MAIN: [phone] ? TOLL FREE: [phone] ? FAX: [phone] [email or web address]

## 2017-11-20 NOTE — Anesthesia Preprocedure Evaluation (Addendum)
Anesthesia Evaluation  Patient identified by MRN, date of birth, ID band Patient awake    Reviewed: Allergy & Precautions, NPO status , Patient's Chart, lab work & pertinent test results  History of Anesthesia Complications (+) PROLONGED EMERGENCE  Airway Mallampati: II  TM Distance: >3 FB Neck ROM: Full    Dental no notable dental hx.    Pulmonary neg pulmonary ROS,    Pulmonary exam normal breath sounds clear to auscultation       Cardiovascular hypertension,  Rhythm:Regular Rate:Normal + Systolic murmurs    Neuro/Psych negative neurological ROS  negative psych ROS   GI/Hepatic negative GI ROS, Neg liver ROS,   Endo/Other  negative endocrine ROS  Renal/GU Renal InsufficiencyRenal disease  negative genitourinary   Musculoskeletal negative musculoskeletal ROS (+)   Abdominal   Peds negative pediatric ROS (+)  Hematology negative hematology ROS (+)   Anesthesia Other Findings   Reproductive/Obstetrics negative OB ROS                            Anesthesia Physical Anesthesia Plan  ASA: II  Anesthesia Plan: General   Post-op Pain Management:    Induction: Intravenous  PONV Risk Score and Plan: 2 and Ondansetron, Dexamethasone and Treatment may vary due to age or medical condition  Airway Management Planned: LMA  Additional Equipment:   Intra-op Plan:   Post-operative Plan: Extubation in OR  Informed Consent: I have reviewed the patients History and Physical, chart, labs and discussed the procedure including the risks, benefits and alternatives for the proposed anesthesia with the patient or authorized representative who has indicated his/her understanding and acceptance.   Dental advisory given  Plan Discussed with: CRNA and Surgeon  Anesthesia Plan Comments:         Anesthesia Quick Evaluation

## 2017-11-20 NOTE — Op Note (Signed)
Operative Note  Jonathan Becker  [id number]  [id number]  11/20/2017   Surgeon: Victorino Sparrow ConnorMD  Assistant: OR staff  Procedure performed: umbilical hernia repair  Preop diagnosis: umbilical hernia Post-op diagnosis/intraop findings: same  Specimens: no Retained items: no EBL: minimal cc Complications: none  Description of procedure: After obtaining informed consent the patient was taken to the operating room and placed supine on operating room table wheregeneral LMA anesthesia was initiated, preoperative antibiotics were administered, SCDs applied, and a formal timeout was performed. The abdomen was clipped, prepped and draped in usual sterile fashion. After infiltration with local, an infraumbilical incision was made sharply and the soft tissues dissected bluntly until the umbilical stalk could be encircled and divided away from the hernia sac using cautery. The sac was circumferentially dissected and intentionally kept intact. The edges of the fascia were cleared off. The defect was 1 cm in diameter. The sac was reduced into the abdomen and then the defect was closed transversely with interrupted 0 Ethibonds. Hemostasis was ensured and the wound. The umbilical skin was tacked back down to the fascia with 3-0 Vicryl and then the skin was closed with subcuticular Monocryl and Dermabond. The patient was then awakened, extubated and taken to PACU in stable condition.   All counts were correct at the completion of the case.

## 2017-11-20 NOTE — Anesthesia Procedure Notes (Signed)
Procedure Name: LMA Insertion Date/Time: 11/20/2017 10:20 AM Performed by: Maxwell Caul, CRNA Pre-anesthesia Checklist: Patient identified, Emergency Drugs available, Suction available and Patient being monitored Patient Re-evaluated:Patient Re-evaluated prior to induction Oxygen Delivery Method: Circle system utilized Preoxygenation: Pre-oxygenation with 100% oxygen Induction Type: IV induction LMA: LMA inserted LMA Size: 5.0 Number of attempts: 1 Tube secured with: Tape Dental Injury: Teeth and Oropharynx as per pre-operative assessment

## 2017-11-20 NOTE — Transfer of Care (Signed)
Immediate Anesthesia Transfer of Care Note  Patient: Jonathan Becker  Procedure(s) Performed: OPEN HERNIA REPAIR UMBILICAL ADULT (N/A )  Patient Location: PACU  Anesthesia Type:General  Level of Consciousness: awake, alert  and oriented  Airway & Oxygen Therapy: Patient Spontanous Breathing and Patient connected to face mask oxygen  Post-op Assessment: Report given to RN and Post -op Vital signs reviewed and stable  Post vital signs: Reviewed and stable  Last Vitals:  Vitals:   11/20/17 0746  BP: (!) 152/60  Pulse: (!) 50  Resp: 18  Temp: 36.7 C  SpO2: 100%    Last Pain:  Vitals:   11/20/17 0746  TempSrc: Oral         Complications: No apparent anesthesia complications

## 2017-11-20 NOTE — Interval H&P Note (Signed)
History and Physical Interval Note:  11/20/2017 9:37 AM  Jonathan Becker  has presented today for surgery, with the diagnosis of umbilical hernia  The various methods of treatment have been discussed with the patient and family. After consideration of risks, benefits and other options for treatment, the patient has consented to  Procedure(s) with comments: OPEN HERNIA REPAIR UMBILICAL ADULT (N/A) - Gen - LMA INSERTION OF MESH (N/A) as a surgical intervention .  The patient's history has been reviewed, patient examined, no change in status, stable for surgery.  I have reviewed the patient's chart and labs.  Questions were answered to the patient's satisfaction.     Pete Merten Rich Brave

## 2017-11-22 NOTE — Anesthesia Postprocedure Evaluation (Signed)
Anesthesia Post Note  Patient: Jonathan Becker  Procedure(s) Performed: OPEN HERNIA REPAIR UMBILICAL ADULT (N/A )     Anesthesia Type: General    Last Vitals:  Vitals:   11/20/17 1143 11/20/17 1151  BP: 127/61 139/63  Pulse: 62 (!) 58  Resp: 10 12  Temp: (!) 36.4 C   SpO2: 97% 96%    Last Pain:  Vitals:   11/20/17 0746  TempSrc: Oral                 Xareni Kelch S

## 2017-12-15 ENCOUNTER — Encounter: Payer: Medicare HMO | Admitting: Internal Medicine

## 2018-02-06 DIAGNOSIS — Z961 Presence of intraocular lens: Secondary | ICD-10-CM | POA: Diagnosis not present

## 2018-02-28 DIAGNOSIS — D225 Melanocytic nevi of trunk: Secondary | ICD-10-CM | POA: Diagnosis not present

## 2018-02-28 DIAGNOSIS — L218 Other seborrheic dermatitis: Secondary | ICD-10-CM | POA: Diagnosis not present

## 2018-02-28 DIAGNOSIS — L814 Other melanin hyperpigmentation: Secondary | ICD-10-CM | POA: Diagnosis not present

## 2018-02-28 DIAGNOSIS — L570 Actinic keratosis: Secondary | ICD-10-CM | POA: Diagnosis not present

## 2018-02-28 DIAGNOSIS — L821 Other seborrheic keratosis: Secondary | ICD-10-CM | POA: Diagnosis not present

## 2018-02-28 DIAGNOSIS — L720 Epidermal cyst: Secondary | ICD-10-CM | POA: Diagnosis not present

## 2018-05-14 DIAGNOSIS — K429 Umbilical hernia without obstruction or gangrene: Secondary | ICD-10-CM | POA: Diagnosis not present

## 2018-05-14 DIAGNOSIS — R972 Elevated prostate specific antigen [PSA]: Secondary | ICD-10-CM | POA: Diagnosis not present

## 2018-05-14 DIAGNOSIS — I129 Hypertensive chronic kidney disease with stage 1 through stage 4 chronic kidney disease, or unspecified chronic kidney disease: Secondary | ICD-10-CM | POA: Diagnosis not present

## 2018-05-14 DIAGNOSIS — N401 Enlarged prostate with lower urinary tract symptoms: Secondary | ICD-10-CM | POA: Diagnosis not present

## 2018-05-14 DIAGNOSIS — N183 Chronic kidney disease, stage 3 (moderate): Secondary | ICD-10-CM | POA: Diagnosis not present

## 2018-05-14 DIAGNOSIS — R001 Bradycardia, unspecified: Secondary | ICD-10-CM | POA: Diagnosis not present

## 2018-05-14 DIAGNOSIS — I10 Essential (primary) hypertension: Secondary | ICD-10-CM | POA: Diagnosis not present

## 2018-05-14 DIAGNOSIS — R5383 Other fatigue: Secondary | ICD-10-CM | POA: Diagnosis not present

## 2018-05-14 DIAGNOSIS — E559 Vitamin D deficiency, unspecified: Secondary | ICD-10-CM | POA: Diagnosis not present

## 2018-05-14 DIAGNOSIS — D692 Other nonthrombocytopenic purpura: Secondary | ICD-10-CM | POA: Diagnosis not present

## 2018-05-15 DIAGNOSIS — R69 Illness, unspecified: Secondary | ICD-10-CM | POA: Diagnosis not present

## 2018-06-23 DIAGNOSIS — R509 Fever, unspecified: Secondary | ICD-10-CM | POA: Diagnosis not present

## 2018-06-23 DIAGNOSIS — B349 Viral infection, unspecified: Secondary | ICD-10-CM | POA: Diagnosis not present

## 2018-06-25 DIAGNOSIS — D72829 Elevated white blood cell count, unspecified: Secondary | ICD-10-CM | POA: Diagnosis not present

## 2018-06-25 DIAGNOSIS — Z6829 Body mass index (BMI) 29.0-29.9, adult: Secondary | ICD-10-CM | POA: Diagnosis not present

## 2018-06-25 DIAGNOSIS — R05 Cough: Secondary | ICD-10-CM | POA: Diagnosis not present

## 2018-06-25 DIAGNOSIS — J189 Pneumonia, unspecified organism: Secondary | ICD-10-CM | POA: Diagnosis not present

## 2018-06-29 DIAGNOSIS — D72829 Elevated white blood cell count, unspecified: Secondary | ICD-10-CM | POA: Diagnosis not present

## 2018-07-02 DIAGNOSIS — D72829 Elevated white blood cell count, unspecified: Secondary | ICD-10-CM | POA: Diagnosis not present

## 2018-07-23 DIAGNOSIS — R69 Illness, unspecified: Secondary | ICD-10-CM | POA: Diagnosis not present

## 2018-07-24 DIAGNOSIS — J189 Pneumonia, unspecified organism: Secondary | ICD-10-CM | POA: Diagnosis not present

## 2018-08-01 DIAGNOSIS — R972 Elevated prostate specific antigen [PSA]: Secondary | ICD-10-CM | POA: Diagnosis not present

## 2018-08-01 DIAGNOSIS — R351 Nocturia: Secondary | ICD-10-CM | POA: Diagnosis not present

## 2018-08-30 DIAGNOSIS — L820 Inflamed seborrheic keratosis: Secondary | ICD-10-CM | POA: Diagnosis not present

## 2018-08-30 DIAGNOSIS — D3617 Benign neoplasm of peripheral nerves and autonomic nervous system of trunk, unspecified: Secondary | ICD-10-CM | POA: Diagnosis not present

## 2018-08-30 DIAGNOSIS — D1801 Hemangioma of skin and subcutaneous tissue: Secondary | ICD-10-CM | POA: Diagnosis not present

## 2018-08-30 DIAGNOSIS — L821 Other seborrheic keratosis: Secondary | ICD-10-CM | POA: Diagnosis not present

## 2018-08-30 DIAGNOSIS — L570 Actinic keratosis: Secondary | ICD-10-CM | POA: Diagnosis not present

## 2018-08-30 DIAGNOSIS — D0439 Carcinoma in situ of skin of other parts of face: Secondary | ICD-10-CM | POA: Diagnosis not present

## 2018-08-30 DIAGNOSIS — D0461 Carcinoma in situ of skin of right upper limb, including shoulder: Secondary | ICD-10-CM | POA: Diagnosis not present

## 2018-08-30 DIAGNOSIS — L814 Other melanin hyperpigmentation: Secondary | ICD-10-CM | POA: Diagnosis not present

## 2018-11-01 ENCOUNTER — Encounter: Payer: Self-pay | Admitting: Internal Medicine

## 2018-11-01 DIAGNOSIS — E559 Vitamin D deficiency, unspecified: Secondary | ICD-10-CM | POA: Diagnosis not present

## 2018-11-01 DIAGNOSIS — E7849 Other hyperlipidemia: Secondary | ICD-10-CM | POA: Diagnosis not present

## 2018-11-01 DIAGNOSIS — Z125 Encounter for screening for malignant neoplasm of prostate: Secondary | ICD-10-CM | POA: Diagnosis not present

## 2018-11-01 DIAGNOSIS — R82998 Other abnormal findings in urine: Secondary | ICD-10-CM | POA: Diagnosis not present

## 2018-11-01 DIAGNOSIS — I10 Essential (primary) hypertension: Secondary | ICD-10-CM | POA: Diagnosis not present

## 2018-11-08 DIAGNOSIS — I129 Hypertensive chronic kidney disease with stage 1 through stage 4 chronic kidney disease, or unspecified chronic kidney disease: Secondary | ICD-10-CM | POA: Diagnosis not present

## 2018-11-08 DIAGNOSIS — Z1339 Encounter for screening examination for other mental health and behavioral disorders: Secondary | ICD-10-CM | POA: Diagnosis not present

## 2018-11-08 DIAGNOSIS — H3530 Unspecified macular degeneration: Secondary | ICD-10-CM | POA: Diagnosis not present

## 2018-11-08 DIAGNOSIS — R972 Elevated prostate specific antigen [PSA]: Secondary | ICD-10-CM | POA: Diagnosis not present

## 2018-11-08 DIAGNOSIS — R0602 Shortness of breath: Secondary | ICD-10-CM | POA: Diagnosis not present

## 2018-11-08 DIAGNOSIS — K429 Umbilical hernia without obstruction or gangrene: Secondary | ICD-10-CM | POA: Diagnosis not present

## 2018-11-08 DIAGNOSIS — E7849 Other hyperlipidemia: Secondary | ICD-10-CM | POA: Diagnosis not present

## 2018-11-08 DIAGNOSIS — N183 Chronic kidney disease, stage 3 (moderate): Secondary | ICD-10-CM | POA: Diagnosis not present

## 2018-11-08 DIAGNOSIS — R0789 Other chest pain: Secondary | ICD-10-CM | POA: Diagnosis not present

## 2018-11-08 DIAGNOSIS — Z0000 Encounter for general adult medical examination without abnormal findings: Secondary | ICD-10-CM | POA: Diagnosis not present

## 2018-11-08 DIAGNOSIS — D692 Other nonthrombocytopenic purpura: Secondary | ICD-10-CM | POA: Diagnosis not present

## 2018-11-08 DIAGNOSIS — Z1331 Encounter for screening for depression: Secondary | ICD-10-CM | POA: Diagnosis not present

## 2018-11-12 DIAGNOSIS — Z1212 Encounter for screening for malignant neoplasm of rectum: Secondary | ICD-10-CM | POA: Diagnosis not present

## 2018-11-15 DIAGNOSIS — R69 Illness, unspecified: Secondary | ICD-10-CM | POA: Diagnosis not present

## 2018-12-31 ENCOUNTER — Telehealth: Payer: Self-pay

## 2018-12-31 NOTE — Telephone Encounter (Signed)
Due to COVID-19 pandemic, the patient will postpone appointment.  He understands he will be called to rearrange once precautions are lifted. He understands to call prior to that time if symptoms worsen.

## 2019-01-02 ENCOUNTER — Ambulatory Visit: Payer: Medicare HMO | Admitting: Cardiovascular Disease

## 2019-02-05 NOTE — Telephone Encounter (Signed)
The patient has been scheduled for an e-visit with Dr. Burt Knack 5/15.

## 2019-02-12 DIAGNOSIS — H52203 Unspecified astigmatism, bilateral: Secondary | ICD-10-CM | POA: Diagnosis not present

## 2019-02-12 DIAGNOSIS — H353232 Exudative age-related macular degeneration, bilateral, with inactive choroidal neovascularization: Secondary | ICD-10-CM | POA: Diagnosis not present

## 2019-02-18 ENCOUNTER — Telehealth: Payer: Self-pay | Admitting: Cardiovascular Disease

## 2019-02-18 ENCOUNTER — Other Ambulatory Visit: Payer: Self-pay

## 2019-02-18 ENCOUNTER — Encounter: Payer: Self-pay | Admitting: Cardiovascular Disease

## 2019-02-18 ENCOUNTER — Telehealth (INDEPENDENT_AMBULATORY_CARE_PROVIDER_SITE_OTHER): Payer: Medicare HMO | Admitting: Cardiovascular Disease

## 2019-02-18 DIAGNOSIS — R0602 Shortness of breath: Secondary | ICD-10-CM

## 2019-02-18 DIAGNOSIS — I10 Essential (primary) hypertension: Secondary | ICD-10-CM

## 2019-02-18 HISTORY — DX: Essential (primary) hypertension: I10

## 2019-02-18 HISTORY — DX: Shortness of breath: R06.02

## 2019-02-18 NOTE — Patient Instructions (Signed)
Medication Instructions:  Your provider recommends that you continue on your current medications as directed. Please refer to the Current Medication list given to you today.    Testing/Procedures: Your provider has requested that you have an echocardiogram. Echocardiography is a painless test that uses sound waves to create images of your heart. It provides your doctor with information about the size and shape of your heart and how well your heart's chambers and valves are working. This procedure takes approximately one hour. There are no restrictions for this procedure.    Your provider has requested that you have a lexiscan myoview. For further information please visit [email or web address]. Please follow instruction sheet, as given.  Dr. Burt Knack recommends you have a CHEST XRAY.  Follow-Up: Your provider recommends that you schedule a follow-up appointment AS NEEDED pending study results.  Any Other Special Instructions Will Be Listed Below (If Applicable).     If you need a refill on your cardiac medications before your next appointment, please call your pharmacy.

## 2019-02-18 NOTE — Progress Notes (Signed)
Virtual Visit via Telephone Note   This visit type was conducted due to national recommendations for restrictions regarding the COVID-19 Pandemic (e.g. social distancing) in an effort to limit this patient's exposure and mitigate transmission in our community.  Due to his co-morbid illnesses, this patient is at least at moderate risk for complications without adequate follow up.  This format is felt to be most appropriate for this patient at this time.  The patient did not have access to video technology/had technical difficulties with video requiring transitioning to audio format only (telephone).  All issues noted in this document were discussed and addressed.  No physical exam could be performed with this format.  Please refer to the patient's chart for his  consent to telehealth for [facility].   Date:  02/18/2019   ID:  Jonathan Becker, DOB 12-Oct-1929, MRN [id number]  Patient Location: Home Provider Location: Home  PCP:  Shon Baton, MD  Cardiologist:  No primary care provider on file.  Electrophysiologist:  None   Evaluation Performed:  New Patient Evaluation  Chief Complaint:  New Consult - Shortness of breath  History of Present Illness:    Jonathan Becker is a 85 y.o. male with shortness of breath,   He has had a very active life with hiking, skiing, and other outdoor activities. He walks every day for exercise. Despite his active lifestyle, he complains of fatigue and shortness of breath. He also complains of bendopnea. Shortness of breath has been present for about one year and more noticeable over the past 6 months. He doesn't feel like it's progressed much over that time period. Symptoms occur with 'moderate exercise.' He denies chest tightness or discomfort with activity. He denies orthopnea or PND. No leg swelling. No palpitations, lightheadedness, or syncope.   He is retired from [employer], moved away for 30 years, and returned here about 3 years ago. He  resides at [facility].  The patient does not have symptoms concerning for COVID-19 infection (fever, chills, cough, or new shortness of breath).   His BP has been well-controlled on antihypertensive medications. He has been on a statin drug in the past but stopped it secondary to myalgias. He otherwise has been in remarkably good health with no significant medical problems.    Past Medical History:  Diagnosis Date   Heart murmur    was told had slight heart murmur years ago   Hypertension    Past Surgical History:  Procedure Laterality Date   EYE SURGERY     bilateral cataract surgery with lens implant   TONSILLECTOMY     age 25   UMBILICAL HERNIA REPAIR N/A 11/20/2017   Procedure: OPEN HERNIA REPAIR UMBILICAL ADULT;  Surgeon: Clovis Riley, MD;  Location: [facility];  Service: General;  Laterality: N/A;  Gen - LMA   VASECTOMY     WISDOM TOOTH EXTRACTION       Current Meds  Medication Sig   amLODipine (NORVASC) 10 MG tablet Take 10 mg by mouth daily.    aspirin EC 81 MG tablet Take 81 mg by mouth daily.   Calcium Citrate 250 MG TABS Take 1 tablet by mouth every evening.   cholecalciferol (VITAMIN D) 1000 units tablet Take 1,000 Units by mouth daily.   hydrochlorothiazide (HYDRODIURIL) 25 MG tablet Take 25 mg by mouth daily.   irbesartan (AVAPRO) 150 MG tablet Take 150 mg by mouth daily.   magnesium gluconate (MAGONATE) 500 MG tablet Take 500 mg by mouth daily.  Multiple Vitamins-Minerals (ICAPS AREDS 2) CAPS Take 1 capsule by mouth 2 (two) times daily.   Multiple Vitamins-Minerals (MULTIVITAMIN WITH MINERALS) tablet Take 1 tablet by mouth daily.   Omega-3 1000 MG CAPS Take 1 capsule by mouth every evening.     Allergies:   Clindamycin/lincomycin and Sulfa antibiotics   Social History   Tobacco Use   Smoking status: Never Smoker   Smokeless tobacco: Never Used  Substance Use Topics   Alcohol use: Yes    Alcohol/week: 0.0 standard drinks    Comment:  1 glass liquor and 1 glass wine nightly   Drug use: No    Family Hx: Father- multiple MI's in his 47's, died at 79 Mother - died at 72 of complications related to Parkinson's Disease Sister - morbid obesity, seizure disorder No other CAD or cardiac problems in first degree relatives  ROS:   Please see the history of present illness.    All other systems reviewed and are negative.  Prior CV studies:   The following studies were reviewed today:  None on file  Labs/Other Tests and Data Reviewed:    EKG:  An ECG dated 11/20/2017 was personally reviewed today and demonstrated:  sinus bradycardia without significant ST-T changes  Recent Labs: No results found for requested labs within last 8760 hours.   Recent Lipid Panel No results found for: CHOL, TRIG, HDL, CHOLHDL, LDLCALC, LDLDIRECT  Wt Readings from Last 3 Encounters:  02/18/19 195 lb (88.5 kg)  11/20/17 191 lb 12.8 oz (87 kg)     Objective:    Vital Signs:  BP (!) 155/67 (BP Location: Left Arm, Patient Position: Sitting, Cuff Size: Normal)    Pulse (!) 52    Ht 5\' 10"  (1.778 m)    Wt 195 lb (88.5 kg)    SpO2 96%    BMI 27.98 kg/m    VITAL SIGNS:  reviewed  Remaining exam not performed because of telehealth nature of this visit. He is in no distress and breathing comfortably in normal conversation.  ASSESSMENT & PLAN:    1. Shortness of breath: No clear pulmonary risk factors with the exception of occasional smoking over a long period of time.  Patient has no cough, fever, or chills.  We will check a PA and lateral chest x-ray.  Certainly at his age with a history of hypertension, hyperlipidemia, and family history of coronary artery disease, cardiac etiologies require further evaluation.  I have recommended an echocardiogram to evaluate cardiac systolic and diastolic function as well as the presence of any valvular heart disease.  I have recommended an exercise Myoview stress test to evaluate for ischemic heart disease as  a potential etiology of his shortness of breath. 2. Essential hypertension: Patient monitors his blood pressure regularly.  He is on multidrug therapy as outlined above.  He is followed by Dr. Virgina Jock. 3. Mixed hyperlipidemia: Statin intolerant.  Will review his labs when available.  For follow-up, will review the results of his echocardiogram and exercise Myoview stress scan.  We will ask him to bring his primary care records and including his lab work when he comes for testing.  Will arrange close follow-up if any of his studies are abnormal.  Otherwise we will plan to see him annually.  COVID-19 Education: The signs and symptoms of COVID-19 were discussed with the patient and how to seek care for testing (follow up with PCP or arrange E-visit).  The importance of social distancing was discussed today.  Time:  Today, I have spent 40 minutes with the patient with telehealth technology discussing the above problems.     Medication Adjustments/Labs and Tests Ordered: Current medicines are reviewed at length with the patient today.  Concerns regarding medicines are outlined above.   Tests Ordered: No orders of the defined types were placed in this encounter.   Medication Changes: No orders of the defined types were placed in this encounter.   Disposition:  Follow up Pending test results   Signed, Sherren Mocha, MD  02/18/2019 8:19 AM    [place]

## 2019-02-18 NOTE — Addendum Note (Signed)
Addended by: Harland German A on: 02/18/2019 05:24 PM   Modules accepted: Orders

## 2019-02-18 NOTE — Telephone Encounter (Signed)
Mr. Cotto returned call to scheduler and scheduled echo and myoview this Friday. Arrival time: 52AM.  He understands instructions (not to eat or drink for 3 hours prior to the test, avoid caffeine and chocolate for 12 hours, not to wear lotion, and to wear comfortable clothes and shoes).  He will have chest x ray done prior to [facility] at [facility]. He was grateful for assistance.      COVID-19 Pre-Screening Questions:  . In the past 7 to 10 days have you had a cough,  shortness of breath, headache, congestion, fever, body aches, chills, sore throat, or sudden loss of taste or sense of smell? No . Have you been around anyone with known Covid 19. NO . Have you been around anyone who is awaiting Covid 19 test results in the past 7 to 10 days? NO . Have you been around anyone who has been exposed to Covid 19, or has mentioned symptoms of Covid 19 within the past 7 to 10 days? NO  If you have any concerns about symptoms your patients report please contact your leadership team, or the provider the patient is seeing in the office for further guidance.

## 2019-02-18 NOTE — Telephone Encounter (Signed)
New message:   Patient calling stating that some called today after his appt. Please call patient.

## 2019-02-19 ENCOUNTER — Encounter: Payer: Self-pay | Admitting: Cardiovascular Disease

## 2019-02-19 NOTE — Telephone Encounter (Signed)
error 

## 2019-02-20 ENCOUNTER — Telehealth (HOSPITAL_COMMUNITY): Payer: Self-pay | Admitting: *Deleted

## 2019-02-20 NOTE — Telephone Encounter (Signed)
Patient given detailed instructions per Myocardial Perfusion Study Information Sheet for the test on 02/22/19 at 11:00. Patient notified to arrive 15 minutes early and that it is imperative to arrive on time for appointment to keep from having the test rescheduled.  If you need to cancel or reschedule your appointment, please call the office within 24 hours of your appointment. . Patient verbalized understanding.Jonathan Becker

## 2019-02-20 NOTE — Telephone Encounter (Signed)
err

## 2019-02-22 ENCOUNTER — Other Ambulatory Visit: Payer: Self-pay

## 2019-02-22 ENCOUNTER — Ambulatory Visit (HOSPITAL_BASED_OUTPATIENT_CLINIC_OR_DEPARTMENT_OTHER): Payer: Medicare HMO

## 2019-02-22 ENCOUNTER — Ambulatory Visit (HOSPITAL_COMMUNITY): Payer: Medicare HMO | Attending: Internal Medicine

## 2019-02-22 ENCOUNTER — Telehealth: Payer: Medicare HMO | Admitting: Cardiovascular Disease

## 2019-02-22 ENCOUNTER — Ambulatory Visit
Admission: RE | Admit: 2019-02-22 | Discharge: 2019-02-22 | Disposition: A | Discharge status: Home / Self Care | Payer: Medicare HMO | Source: Ambulatory Visit | Attending: Cardiovascular Disease | Admitting: Cardiovascular Disease

## 2019-02-22 DIAGNOSIS — R0602 Shortness of breath: Secondary | ICD-10-CM | POA: Insufficient documentation

## 2019-02-22 LAB — MYOCARDIAL PERFUSION IMAGING
LV dias vol: 88 mL (ref 62–150)
LV sys vol: 30 mL
Peak HR: 70 {beats}/min
Rest HR: 51 {beats}/min
SDS: 1
SRS: 1
SSS: 2
TID: 1.09

## 2019-02-22 LAB — ECHOCARDIOGRAM COMPLETE
Height: 70 in
Weight: 3120 oz

## 2019-02-22 MED ORDER — TECHNETIUM TC 99M TETROFOSMIN IV KIT
8.3000 | PACK | Freq: Once | INTRAVENOUS | Status: AC | PRN
  Administered 2019-02-22: 8.3 via INTRAVENOUS
  Filled 2019-02-22: qty 9

## 2019-02-22 MED ORDER — REGADENOSON 0.4 MG/5ML IV SOLN
0.4000 mg | Freq: Once | INTRAVENOUS | Status: AC
  Administered 2019-02-22: 0.4 mg via INTRAVENOUS

## 2019-02-22 MED ORDER — TECHNETIUM TC 99M TETROFOSMIN IV KIT
26.7000 | PACK | Freq: Once | INTRAVENOUS | Status: AC | PRN
  Administered 2019-02-22: 26.7 via INTRAVENOUS
  Filled 2019-02-22: qty 27

## 2019-03-14 DIAGNOSIS — L218 Other seborrheic dermatitis: Secondary | ICD-10-CM | POA: Diagnosis not present

## 2019-03-14 DIAGNOSIS — C44529 Squamous cell carcinoma of skin of other part of trunk: Secondary | ICD-10-CM | POA: Diagnosis not present

## 2019-03-14 DIAGNOSIS — L570 Actinic keratosis: Secondary | ICD-10-CM | POA: Diagnosis not present

## 2019-03-14 DIAGNOSIS — D225 Melanocytic nevi of trunk: Secondary | ICD-10-CM | POA: Diagnosis not present

## 2019-03-14 DIAGNOSIS — L820 Inflamed seborrheic keratosis: Secondary | ICD-10-CM | POA: Diagnosis not present

## 2019-03-14 DIAGNOSIS — D044 Carcinoma in situ of skin of scalp and neck: Secondary | ICD-10-CM | POA: Diagnosis not present

## 2019-03-14 DIAGNOSIS — Z85828 Personal history of other malignant neoplasm of skin: Secondary | ICD-10-CM | POA: Diagnosis not present

## 2019-03-14 DIAGNOSIS — D485 Neoplasm of uncertain behavior of skin: Secondary | ICD-10-CM | POA: Diagnosis not present

## 2019-03-14 DIAGNOSIS — D1801 Hemangioma of skin and subcutaneous tissue: Secondary | ICD-10-CM | POA: Diagnosis not present

## 2019-03-14 DIAGNOSIS — L821 Other seborrheic keratosis: Secondary | ICD-10-CM | POA: Diagnosis not present

## 2019-03-26 ENCOUNTER — Telehealth: Payer: Self-pay

## 2019-03-26 NOTE — Telephone Encounter (Signed)
Called to COVID screen the patient for his appointment tomorrow. He understands to call if anything changes prior to appointment.  COVID-19 Pre-Screening Questions:  . In the past 7 to 10 days have you had a cough,  shortness of breath, headache, congestion, fever (100 or greater) body aches, chills, sore throat, or sudden loss of taste or sense of smell? NO . Have you been around anyone with known Covid 19. NO . Have you been around anyone who is awaiting Covid 19 test results in the past 7 to 10 days? NO . Have you been around anyone who has been exposed to Covid 19, or has mentioned symptoms of Covid 19 within the past 7 to 10 days? NO

## 2019-03-27 ENCOUNTER — Encounter: Payer: Self-pay | Admitting: Cardiovascular Disease

## 2019-03-27 ENCOUNTER — Ambulatory Visit: Payer: Medicare HMO | Admitting: Cardiovascular Disease

## 2019-03-27 ENCOUNTER — Other Ambulatory Visit: Payer: Self-pay

## 2019-03-27 VITALS — BP 128/62 | HR 59 | Ht 70.0 in | Wt 200.2 lb

## 2019-03-27 DIAGNOSIS — R0602 Shortness of breath: Secondary | ICD-10-CM | POA: Diagnosis not present

## 2019-03-27 NOTE — Progress Notes (Signed)
Cardiology Office Note:    Date:  03/27/2019   ID:  Jonathan Becker, DOB 01-03-1930, MRN [id number]  PCP:  Shon Baton, MD  Cardiologist:  Sherren Mocha, MD  Electrophysiologist:  None   Referring MD: Shon Baton, MD   Chief Complaint  Patient presents with  . Shortness of Breath    History of Present Illness:    Jonathan Becker is a 85 y.o. male with a hx of shortness of breath, initially evaluated via telemedicine visit Feb 18, 2019.  He presents today for follow-up evaluation.  His past medical history included hypertension and heart murmur.  He underwent evaluation that included a chest x-ray, echocardiogram, and myocardial perfusion study.  The patient's chest x-ray showed no significant abnormalities.  The echocardiogram demonstrated normal LV and RV systolic function with impaired relaxation noted.  The aortic valve had some degenerative changes but no significant stenosis.  The patient's myocardial perfusion scan demonstrated no evidence of myocardial ischemia.  The patient is here alone today. He lives at [place] and gets out regularly for walks. He walked 1/5 miles today in 25 minutes. He complains of shortness of breath with walking, but he doesn't have to stop and rest. He denies chest pain, chest pressure, leg edema, orthopnea, PND, or lightheadedness.  Notes that he is gained about 10 pounds over the last year.  No other specific complaints today.  Past Medical History:  Diagnosis Date  . Essential hypertension 02/18/2019  . Heart murmur    was told had slight heart murmur years ago  . Hypertension   . Shortness of breath 02/18/2019    Past Surgical History:  Procedure Laterality Date  . EYE SURGERY     bilateral cataract surgery with lens implant  . TONSILLECTOMY     age 89  . UMBILICAL HERNIA REPAIR N/A 11/20/2017   Procedure: OPEN HERNIA REPAIR UMBILICAL ADULT;  Surgeon: Clovis Riley, MD;  Location: [facility];  Service: General;  Laterality: N/A;  Gen -  LMA  . VASECTOMY    . WISDOM TOOTH EXTRACTION      Current Medications: Current Meds  Medication Sig  . amLODipine (NORVASC) 10 MG tablet Take 10 mg by mouth daily.   Marland Kitchen aspirin EC 81 MG tablet Take 81 mg by mouth daily.  . Calcium Citrate 250 MG TABS Take 1 tablet by mouth every evening.  . cholecalciferol (VITAMIN D) 1000 units tablet Take 1,000 Units by mouth daily.  . fluocinonide (LIDEX) 0.05 % external solution APPLY 1ML ON THE SKIN AS DIRECTED; APPLY SMALL AMOUT TO THE SCALP AS NEEDED FOR ITCHING.  . hydrochlorothiazide (HYDRODIURIL) 25 MG tablet Take 25 mg by mouth daily.  . irbesartan (AVAPRO) 150 MG tablet Take 150 mg by mouth daily.  . magnesium gluconate (MAGONATE) 500 MG tablet Take 500 mg by mouth daily.  . Multiple Vitamins-Minerals (ICAPS AREDS 2) CAPS Take 1 capsule by mouth 2 (two) times daily.  . Multiple Vitamins-Minerals (MULTIVITAMIN WITH MINERALS) tablet Take 1 tablet by mouth daily.  . Omega-3 1000 MG CAPS Take 1 capsule by mouth every evening.     Allergies:   Clindamycin/lincomycin and Sulfa antibiotics   Social History   Socioeconomic History  . Marital status: Married    Spouse name: Not on file  . Number of children: Not on file  . Years of education: Not on file  . Highest education level: Not on file  Occupational History  . Not on file  Social Needs  . Financial  resource strain: Not on file  . Food insecurity    Worry: Not on file    Inability: Not on file  . Transportation needs    Medical: Not on file    Non-medical: Not on file  Tobacco Use  . Smoking status: Never Smoker  . Smokeless tobacco: Never Used  Substance and Sexual Activity  . Alcohol use: Yes    Alcohol/week: 0.0 standard drinks    Comment: 1 glass liquor and 1 glass wine nightly  . Drug use: No  . Sexual activity: Not on file  Lifestyle  . Physical activity    Days per week: Not on file    Minutes per session: Not on file  . Stress: Not on file  Relationships  .  Social Herbalist on phone: Not on file    Gets together: Not on file    Attends religious service: Not on file    Active member of club or organization: Not on file    Attends meetings of clubs or organizations: Not on file    Relationship status: Not on file  Other Topics Concern  . Not on file  Social History Narrative  . Not on file     Family History: The patient's family history includes Heart attack in his father.  ROS:   Please see the history of present illness.    All other systems reviewed and are negative.  EKGs/Labs/Other Studies Reviewed:    The following studies were reviewed today: Chest x-ray 02/22/2019: FINDINGS: The cardiomediastinal silhouette is within normal limits. Aortic atherosclerosis is noted. There is minimal atelectasis or scarring in the lung bases. The lungs are otherwise clear. No edema, pleural effusion, pneumothorax is identified. No acute osseous abnormality is seen.  IMPRESSION: No active cardiopulmonary disease.  Echocardiogram 02/22/2019: IMPRESSIONS    1. The left ventricle has hyperdynamic systolic function, with an ejection fraction of >65%. The cavity size was normal. There is moderately increased left ventricular wall thickness. Left ventricular diastolic Doppler parameters are consistent with  impaired relaxation. Elevated left ventricular end-diastolic pressure The E/e' is 15.8.  2. The right ventricle has normal systolic function. The cavity was normal. There is no increase in right ventricular wall thickness. Right ventricular systolic pressure could not be assessed.  3. Mild calcification of the aortic valve. Aortic valve regurgitation is trivial by color flow Doppler. No aortic valve stenosis.  4. No interatrial shunt detected.  5. The aortic root and ascending aorta are normal in size and structure.  Myocardial perfusion study 02/22/2019: Study Highlights   Nuclear stress EF: 66%. The left ventricular  ejection fraction is hyperdynamic (>65%).  There was no ST segment deviation noted during stress.  This is a low risk study. No evidence of ischemia or previous infarction  The study is normal.    EKG:  EKG is ordered today.  The ekg ordered today demonstrates sinus rhythm 59 bpm, left axis deviation, otherwise normal  Recent Labs: No results found for requested labs within last 8760 hours.  Recent Lipid Panel No results found for: CHOL, TRIG, HDL, CHOLHDL, VLDL, LDLCALC, LDLDIRECT  Physical Exam:    VS:  BP 128/62   Pulse (!) 59   Ht 5\' 10"  (1.778 m)   Wt 200 lb 3.2 oz (90.8 kg)   SpO2 96%   BMI 28.73 kg/m     Wt Readings from Last 3 Encounters:  03/27/19 200 lb 3.2 oz (90.8 kg)  02/22/19 195 lb (88.5  kg)  02/18/19 195 lb (88.5 kg)     GEN:  Well nourished, well developed elderly male in no acute distress HEENT: Normal NECK: No JVD; No carotid bruits LYMPHATICS: No lymphadenopathy CARDIAC: RRR, no murmurs, rubs, gallops RESPIRATORY:  Clear to auscultation without rales, wheezing or rhonchi  ABDOMEN: Soft, non-tender, non-distended MUSCULOSKELETAL:  No edema; No deformity  SKIN: Warm and dry NEUROLOGIC:  Alert and oriented x 3 PSYCHIATRIC:  Normal affect   ASSESSMENT:    1. Shortness of breath    PLAN:    In order of problems listed above:  1. The patient appears stable from a cardiac perspective.  I reviewed all of his recent studies which are reassuring.  His nuclear scan demonstrated no ischemia.  His echo study demonstrated normal LV function and no significant valvular disease.  He does have some diastolic dysfunction which is almost inherent at his advanced age.  His medical program appears appropriate.  He is on a diuretic with good control of his blood pressure.  I did not recommend any changes today.  We discussed how impactful even a modest degree of weight loss can be with respect to exertional dyspnea. He will work on losing 10 weight. I will plan to  see him back on an annual basis.  He follows closely with Dr. Virgina Jock.    Medication Adjustments/Labs and Tests Ordered: Current medicines are reviewed at length with the patient today.  Concerns regarding medicines are outlined above.  Orders Placed This Encounter  Procedures  . EKG 12-Lead   No orders of the defined types were placed in this encounter.   Patient Instructions  Medication Instructions:  Your provider recommends that you continue on your current medications as directed. Please refer to the Current Medication list given to you today.      Follow-Up: Your provider wants you to follow-up in: 1 year with Dr. Burt Knack. You will receive a reminder letter in the mail two months in advance. If you don't receive a letter, please call our office to schedule the follow-up appointment.      Signed, Sherren Mocha, MD  03/27/2019 1:40 PM    [place]

## 2019-03-27 NOTE — Patient Instructions (Signed)
Medication Instructions:  Your provider recommends that you continue on your current medications as directed. Please refer to the Current Medication list given to you today.    Follow-Up: Your provider wants you to follow-up in: 1 year with Dr. Cooper. You will receive a reminder letter in the mail two months in advance. If you don't receive a letter, please call our office to schedule the follow-up appointment.    

## 2019-05-16 DIAGNOSIS — R296 Repeated falls: Secondary | ICD-10-CM | POA: Diagnosis not present

## 2019-05-16 DIAGNOSIS — R69 Illness, unspecified: Secondary | ICD-10-CM | POA: Diagnosis not present

## 2019-05-16 DIAGNOSIS — E213 Hyperparathyroidism, unspecified: Secondary | ICD-10-CM | POA: Diagnosis not present

## 2019-05-16 DIAGNOSIS — D500 Iron deficiency anemia secondary to blood loss (chronic): Secondary | ICD-10-CM | POA: Diagnosis not present

## 2019-05-21 DIAGNOSIS — D692 Other nonthrombocytopenic purpura: Secondary | ICD-10-CM | POA: Diagnosis not present

## 2019-05-21 DIAGNOSIS — H3530 Unspecified macular degeneration: Secondary | ICD-10-CM | POA: Diagnosis not present

## 2019-05-21 DIAGNOSIS — I129 Hypertensive chronic kidney disease with stage 1 through stage 4 chronic kidney disease, or unspecified chronic kidney disease: Secondary | ICD-10-CM | POA: Diagnosis not present

## 2019-05-21 DIAGNOSIS — R0602 Shortness of breath: Secondary | ICD-10-CM | POA: Diagnosis not present

## 2019-05-21 DIAGNOSIS — R001 Bradycardia, unspecified: Secondary | ICD-10-CM | POA: Diagnosis not present

## 2019-05-21 DIAGNOSIS — N401 Enlarged prostate with lower urinary tract symptoms: Secondary | ICD-10-CM | POA: Diagnosis not present

## 2019-05-21 DIAGNOSIS — N183 Chronic kidney disease, stage 3 (moderate): Secondary | ICD-10-CM | POA: Diagnosis not present

## 2019-05-21 DIAGNOSIS — R972 Elevated prostate specific antigen [PSA]: Secondary | ICD-10-CM | POA: Diagnosis not present

## 2019-05-23 DIAGNOSIS — R972 Elevated prostate specific antigen [PSA]: Secondary | ICD-10-CM | POA: Diagnosis not present

## 2019-05-23 DIAGNOSIS — I5189 Other ill-defined heart diseases: Secondary | ICD-10-CM | POA: Diagnosis not present

## 2019-07-28 DIAGNOSIS — R69 Illness, unspecified: Secondary | ICD-10-CM | POA: Diagnosis not present

## 2019-07-30 DIAGNOSIS — R972 Elevated prostate specific antigen [PSA]: Secondary | ICD-10-CM | POA: Diagnosis not present

## 2019-08-07 DIAGNOSIS — R972 Elevated prostate specific antigen [PSA]: Secondary | ICD-10-CM | POA: Diagnosis not present

## 2019-09-18 DIAGNOSIS — R972 Elevated prostate specific antigen [PSA]: Secondary | ICD-10-CM | POA: Diagnosis not present

## 2019-11-05 DIAGNOSIS — Z125 Encounter for screening for malignant neoplasm of prostate: Secondary | ICD-10-CM | POA: Diagnosis not present

## 2019-11-05 DIAGNOSIS — Z0000 Encounter for general adult medical examination without abnormal findings: Secondary | ICD-10-CM | POA: Diagnosis not present

## 2019-11-05 DIAGNOSIS — E7849 Other hyperlipidemia: Secondary | ICD-10-CM | POA: Diagnosis not present

## 2019-11-05 DIAGNOSIS — E559 Vitamin D deficiency, unspecified: Secondary | ICD-10-CM | POA: Diagnosis not present

## 2019-11-05 DIAGNOSIS — I10 Essential (primary) hypertension: Secondary | ICD-10-CM | POA: Diagnosis not present

## 2019-11-06 DIAGNOSIS — R82998 Other abnormal findings in urine: Secondary | ICD-10-CM | POA: Diagnosis not present

## 2019-11-08 DIAGNOSIS — Z1212 Encounter for screening for malignant neoplasm of rectum: Secondary | ICD-10-CM | POA: Diagnosis not present

## 2019-11-11 DIAGNOSIS — I129 Hypertensive chronic kidney disease with stage 1 through stage 4 chronic kidney disease, or unspecified chronic kidney disease: Secondary | ICD-10-CM | POA: Diagnosis not present

## 2019-11-11 DIAGNOSIS — E785 Hyperlipidemia, unspecified: Secondary | ICD-10-CM | POA: Diagnosis not present

## 2019-11-11 DIAGNOSIS — R972 Elevated prostate specific antigen [PSA]: Secondary | ICD-10-CM | POA: Diagnosis not present

## 2019-11-11 DIAGNOSIS — K429 Umbilical hernia without obstruction or gangrene: Secondary | ICD-10-CM | POA: Diagnosis not present

## 2019-11-11 DIAGNOSIS — R0602 Shortness of breath: Secondary | ICD-10-CM | POA: Diagnosis not present

## 2019-11-11 DIAGNOSIS — N1831 Chronic kidney disease, stage 3a: Secondary | ICD-10-CM | POA: Diagnosis not present

## 2019-11-11 DIAGNOSIS — Z0000 Encounter for general adult medical examination without abnormal findings: Secondary | ICD-10-CM | POA: Diagnosis not present

## 2019-11-11 DIAGNOSIS — D692 Other nonthrombocytopenic purpura: Secondary | ICD-10-CM | POA: Diagnosis not present

## 2019-11-11 DIAGNOSIS — I5189 Other ill-defined heart diseases: Secondary | ICD-10-CM | POA: Diagnosis not present

## 2019-11-11 DIAGNOSIS — Z1331 Encounter for screening for depression: Secondary | ICD-10-CM | POA: Diagnosis not present

## 2019-11-11 DIAGNOSIS — R001 Bradycardia, unspecified: Secondary | ICD-10-CM | POA: Diagnosis not present

## 2019-11-15 DIAGNOSIS — C61 Malignant neoplasm of prostate: Secondary | ICD-10-CM | POA: Diagnosis not present

## 2019-11-15 DIAGNOSIS — R972 Elevated prostate specific antigen [PSA]: Secondary | ICD-10-CM | POA: Diagnosis not present

## 2019-11-18 DIAGNOSIS — R69 Illness, unspecified: Secondary | ICD-10-CM | POA: Diagnosis not present

## 2019-11-22 ENCOUNTER — Other Ambulatory Visit: Payer: Self-pay | Admitting: Urology

## 2019-11-22 ENCOUNTER — Other Ambulatory Visit (HOSPITAL_COMMUNITY): Payer: Self-pay | Admitting: Urology

## 2019-11-22 DIAGNOSIS — C61 Malignant neoplasm of prostate: Secondary | ICD-10-CM

## 2019-12-04 DIAGNOSIS — C61 Malignant neoplasm of prostate: Secondary | ICD-10-CM | POA: Diagnosis not present

## 2019-12-06 ENCOUNTER — Encounter (HOSPITAL_COMMUNITY)
Admission: RE | Admit: 2019-12-06 | Discharge: 2019-12-06 | Disposition: A | Discharge status: Home / Self Care | Payer: Medicare HMO | Source: Ambulatory Visit | Attending: Urology | Admitting: Urology

## 2019-12-06 ENCOUNTER — Other Ambulatory Visit: Payer: Self-pay

## 2019-12-06 DIAGNOSIS — C61 Malignant neoplasm of prostate: Secondary | ICD-10-CM | POA: Insufficient documentation

## 2019-12-06 MED ORDER — TECHNETIUM TC 99M MEDRONATE IV KIT
21.8000 | PACK | Freq: Once | INTRAVENOUS | Status: AC
  Administered 2019-12-06: 12:00:00 21.8 via INTRAVENOUS

## 2019-12-20 DIAGNOSIS — L821 Other seborrheic keratosis: Secondary | ICD-10-CM | POA: Diagnosis not present

## 2019-12-20 DIAGNOSIS — L814 Other melanin hyperpigmentation: Secondary | ICD-10-CM | POA: Diagnosis not present

## 2019-12-20 DIAGNOSIS — D1801 Hemangioma of skin and subcutaneous tissue: Secondary | ICD-10-CM | POA: Diagnosis not present

## 2019-12-20 DIAGNOSIS — Z85828 Personal history of other malignant neoplasm of skin: Secondary | ICD-10-CM | POA: Diagnosis not present

## 2019-12-20 DIAGNOSIS — D225 Melanocytic nevi of trunk: Secondary | ICD-10-CM | POA: Diagnosis not present

## 2019-12-20 DIAGNOSIS — L570 Actinic keratosis: Secondary | ICD-10-CM | POA: Diagnosis not present

## 2019-12-20 DIAGNOSIS — L720 Epidermal cyst: Secondary | ICD-10-CM | POA: Diagnosis not present

## 2019-12-25 DIAGNOSIS — C61 Malignant neoplasm of prostate: Secondary | ICD-10-CM | POA: Diagnosis not present

## 2020-01-27 NOTE — Progress Notes (Signed)
GU Location of Tumor / Histology: prostatic adenocarcinoma  If Prostate Cancer, Gleason Score is (4 + 3) and PSA is (14.186). Prostate volume: 46.22 grams  Jonathan Becker presented  months ago with signs/symptoms of: increasing PSA over time    Biopsies of prostate (if applicable) revealed:   Past/Anticipated interventions by urology, if any: prostate biopsy, CT abd/pelvis (negative), bone scan (negative), referral for consideration of EXRT, ADT (short course) discussed  Renal lesion noted but Dr. Diona Fanti doesn't feel as thought it needs further follow up.  Past/Anticipated interventions by medical oncology, if any: no  Weight changes, if [id number]  Bowel/Bladder complaints, if any: Taking tamsulosin 0.4mg .   Nausea/Vomiting, if [id number]  Pain issues, if any:  none  SAFETY ISSUES:  Prior radiation? no  Pacemaker/ICD? no  Possible current pregnancy? no, male patient  Is the patient on methotrexate? no  Current Complaints / other details:  86 year old male. Married. Retired. AUA score of 10. States that the tamulosin is helping tremdously. Denies any issues at this time denies pain.

## 2020-01-28 ENCOUNTER — Ambulatory Visit
Admission: RE | Admit: 2020-01-28 | Discharge: 2020-01-28 | Disposition: A | Discharge status: Home / Self Care | Payer: Medicare HMO | Source: Ambulatory Visit | Attending: Radiation Oncology | Admitting: Radiation Oncology

## 2020-01-28 ENCOUNTER — Encounter: Payer: Self-pay | Admitting: Urology

## 2020-01-28 ENCOUNTER — Encounter: Payer: Self-pay | Admitting: Radiation Oncology

## 2020-01-28 DIAGNOSIS — C61 Malignant neoplasm of prostate: Secondary | ICD-10-CM | POA: Insufficient documentation

## 2020-01-28 DIAGNOSIS — R972 Elevated prostate specific antigen [PSA]: Secondary | ICD-10-CM | POA: Diagnosis not present

## 2020-01-28 NOTE — Patient Instructions (Signed)
Coronavirus (COVID-19) Are you at risk?  Are you at risk for the Coronavirus (COVID-19)?  To be considered HIGH RISK for Coronavirus (COVID-19), you have to meet the following criteria:  . Traveled to [place], [place], [place], [place] or [place]; or in the [place] to [place], [place], [place], or [place]; and have fever, cough, and shortness of breath within the last 2 weeks of travel OR . Been in close contact with a person diagnosed with COVID-19 within the last 2 weeks and have fever, cough, and shortness of breath . IF YOU DO NOT MEET THESE CRITERIA, YOU ARE CONSIDERED LOW RISK FOR COVID-19.  What to do if you are HIGH RISK for COVID-19?  . If you are having a medical emergency, call 911. . Seek medical care right away. Before you go to a doctor's office, urgent care or emergency department, call ahead and tell them about your recent travel, contact with someone diagnosed with COVID-19, and your symptoms. You should receive instructions from your physician's office regarding next steps of care.  . When you arrive at healthcare provider, tell the healthcare staff immediately you have returned from visiting [place], [place], [place], [place] or [place]; or traveled in the [place] to [place], [place], [place], or [place]; in the last two weeks or you have been in close contact with a person diagnosed with COVID-19 in the last 2 weeks.   . Tell the health care staff about your symptoms: fever, cough and shortness of breath. . After you have been seen by a medical provider, you will be either: o Tested for (COVID-19) and discharged home on quarantine except to seek medical care if symptoms worsen, and asked to  - Stay home and avoid contact with others until you get your results (4-5 days)  - Avoid travel on public transportation if possible (such as bus, train, or airplane) or o Sent to the Emergency Department by EMS for evaluation, COVID-19 testing, and possible  admission depending on your condition and test results.  What to do if you are LOW RISK for COVID-19?  Reduce your risk of any infection by using the same precautions used for avoiding the common cold or flu:  . Wash your hands often with soap and warm water for at least 20 seconds.  If soap and water are not readily available, use an alcohol-based hand sanitizer with at least 60% alcohol.  . If coughing or sneezing, cover your mouth and nose by coughing or sneezing into the elbow areas of your shirt or coat, into a tissue or into your sleeve (not your hands). . Avoid shaking hands with others and consider head nods or verbal greetings only. . Avoid touching your eyes, nose, or mouth with unwashed hands.  . Avoid close contact with people who are sick. . Avoid places or events with large numbers of people in one location, like concerts or sporting events. . Carefully consider travel plans you have or are making. . If you are planning any travel outside or inside the [place], visit the CDC's Travelers' Health webpage for the latest health notices. . If you have some symptoms but not all symptoms, continue to monitor at home and seek medical attention if your symptoms worsen. . If you are having a medical emergency, call 911.   ADDITIONAL HEALTHCARE OPTIONS FOR PATIENTS  [facility] / e-Visit: [email or web address]         [facility]: [phone]  [facility]: [phone]                   [facility]: [phone]   

## 2020-01-28 NOTE — Progress Notes (Signed)
Radiation Oncology         [phone] ________________________________  Initial Outpatient Consultation - Conducted via Telephone due to current COVID-19 concerns for limiting patient exposure  Name: Jonathan Becker MRN: [id number]  Date: 01/28/2020  DOB: 08-Oct-1930  [id number], Jonathan Reichmann, MD  Jonathan Gallo, MD   REFERRING PHYSICIAN: Franchot Gallo, MD  DIAGNOSIS: 86 y.o. gentleman with Stage T2a adenocarcinoma of the prostate with Gleason score of 4+3, and PSA of 14.2.    ICD-10-CM   1. Malignant neoplasm of prostate ([facility])  C61     HISTORY OF PRESENT ILLNESS: Jonathan Becker is a 86 y.o. male with a diagnosis of prostate cancer. He has a longstanding history of elevated PSA dating back to 2008 when it had risen to 4.23. He was recommended to undergo ultrasound and biopsy of the prostate at that time, but he declined. He was placed on Avodart, which brought the PSA down to 1.9 in 2011 leading them to discontinue the medicine.  More recently, he was noted to have an elevated PSA of 13.9 by his primary care physician, Dr. Virgina Becker.  Accordingly, he was referred for evaluation in urology by Dr. Diona Becker on 08/07/2019,  digital rectal examination was performed at that time revealing some right lobe firmness/nodularity, consistent with prior DRE exams in 2017 and 2018. He opted to repeat a PSA before proceeding with biopsy. PSA in 09/2019 increased to 15.8 and in 10/2019 was slightly lower but still significantly elevated at 14.19. The patient proceeded to transrectal ultrasound with 12 biopsies of the prostate on 11/15/2019.  The prostate volume measured 46.22 cc.  Out of 12 core biopsies, 8 were positive.  The maximum Gleason score was 4+3, and this was seen in the right mid. Additionally, Gleason 3+4 was seen in the left mid lateral (with PNI), left apex lateral (with PNI), left mid, left apex (small focus), right apex, right mid lateral, and right apex lateral (with PNI).  He underwent CT  A/P on 12/04/2019 showing some indeterminate soft tissue fullness in region of the left UVJ without no evidence of lymphadenopathy or findings to suggest metastatic prostate cancer.  There was a 2.9 cm cystic lesion in interpolar left kidney. Dr. Diona Becker performed cystoscopy at follow up on 12/25/2019, which showed no urothelial abnormality. Per his note, Dr. Diona Becker does not feel that the renal lesion needs further follow up.  A bone scan performed on 12/06/2019 was negative for evidence of osseous metastatic disease.  The patient reviewed the biopsy results with his urologist and he has kindly been referred today for discussion of potential radiation treatment options. He is a healthy, active 86 year old gentleman with excellent performance status and projected longevity, warranting a curative treatment approach.   PREVIOUS RADIATION THERAPY: No  PAST MEDICAL HISTORY:  Past Medical History:  Diagnosis Date  . Essential hypertension 02/18/2019  . Heart murmur    was told had slight heart murmur years ago  . Hypertension   . Shortness of breath 02/18/2019      PAST SURGICAL HISTORY: Past Surgical History:  Procedure Laterality Date  . EYE SURGERY     bilateral cataract surgery with lens implant  . TONSILLECTOMY     age 65  . UMBILICAL HERNIA REPAIR N/A 11/20/2017   Procedure: OPEN HERNIA REPAIR UMBILICAL ADULT;  Surgeon: Jonathan Riley, MD;  Location: [facility];  Service: General;  Laterality: N/A;  Gen - LMA  . VASECTOMY    . WISDOM TOOTH EXTRACTION  FAMILY HISTORY:  Family History  Problem Relation Age of Onset  . Heart attack Father     SOCIAL HISTORY:  Social History   Socioeconomic History  . Marital status: Married    Spouse name: Not on file  . Number of children: Not on file  . Years of education: Not on file  . Highest education level: Not on file  Occupational History  . Not on file  Tobacco Use  . Smoking status: Never Smoker  . Smokeless tobacco: Never  Used  Substance and Sexual Activity  . Alcohol use: Yes    Alcohol/week: 0.0 standard drinks    Comment: 1 glass liquor and 1 glass wine nightly  . Drug use: No  . Sexual activity: Not on file  Other Topics Concern  . Not on file  Social History Narrative  . Not on file   Social Determinants of Health   Financial Resource Strain:   . Difficulty of Paying Living Expenses:   Food Insecurity:   . Worried About Charity fundraiser in the Last Year:   . Arboriculturist in the Last Year:   Transportation Needs:   . Film/video editor (Medical):   Marland Kitchen Lack of Transportation (Non-Medical):   Physical Activity:   . Days of Exercise per Week:   . Minutes of Exercise per Session:   Stress:   . Feeling of Stress :   Social Connections:   . Frequency of Communication with Friends and Family:   . Frequency of Social Gatherings with Friends and Family:   . Attends Religious Services:   . Active Member of Clubs or Organizations:   . Attends Archivist Meetings:   Marland Kitchen Marital Status:   Intimate Partner Violence:   . Fear of Current or Ex-Partner:   . Emotionally Abused:   Marland Kitchen Physically Abused:   . Sexually Abused:     ALLERGIES: Clindamycin/lincomycin and Sulfa antibiotics  MEDICATIONS:  Current Outpatient Medications  Medication Sig Dispense Refill  . amLODipine (NORVASC) 10 MG tablet Take 10 mg by mouth daily.     Marland Kitchen aspirin EC 81 MG tablet Take 81 mg by mouth daily.    . Calcium Citrate 250 MG TABS Take 1 tablet by mouth every evening.    . cholecalciferol (VITAMIN D) 1000 units tablet Take 1,000 Units by mouth daily.    . fluocinonide (LIDEX) 0.05 % external solution APPLY 1ML ON THE SKIN AS DIRECTED; APPLY SMALL AMOUT TO THE SCALP AS NEEDED FOR ITCHING.    . hydrochlorothiazide (HYDRODIURIL) 25 MG tablet Take 25 mg by mouth daily.    . irbesartan (AVAPRO) 150 MG tablet Take 150 mg by mouth daily.    . magnesium gluconate (MAGONATE) 500 MG tablet Take 500 mg by mouth  daily.    . Multiple Vitamins-Minerals (ICAPS AREDS 2) CAPS Take 1 capsule by mouth 2 (two) times daily.    . Multiple Vitamins-Minerals (MULTIVITAMIN WITH MINERALS) tablet Take 1 tablet by mouth daily.    . Omega-3 1000 MG CAPS Take 1 capsule by mouth every evening.    . tamsulosin (FLOMAX) 0.4 MG CAPS capsule Take 0.4 mg by mouth daily.     No current facility-administered medications for this encounter.    REVIEW OF SYSTEMS:  On review of systems, the patient reports that he is doing well overall. He denies any chest pain, shortness of breath, cough, fevers, chills, night sweats, unintended weight changes. He denies any bowel disturbances, and denies  abdominal pain, nausea or vomiting. He denies any new musculoskeletal or joint aches or pains. His IPSS was 10, indicating moderate urinary symptoms. He endorses taking tamsulosin, which he reports is helping tremendously. A complete review of systems is obtained and is otherwise negative.  PHYSICAL EXAM:  Wt Readings from Last 3 Encounters:  03/27/19 200 lb 3.2 oz (90.8 kg)  02/22/19 195 lb (88.5 kg)  02/18/19 195 lb (88.5 kg)   Temp Readings from Last 3 Encounters:  11/20/17 (!) 97.5 F (36.4 C)   BP Readings from Last 3 Encounters:  03/27/19 128/62  02/18/19 (!) 155/67  11/20/17 139/63   Pulse Readings from Last 3 Encounters:  03/27/19 (!) 59  02/18/19 (!) 52  11/20/17 (!) 58   Pain Assessment Pain Score: 0-No pain/10  Physical exam not performed in light of telephone consult visit format.  KPS = 90  100 - Normal; no complaints; no evidence of disease. 90   - Able to carry on normal activity; minor signs or symptoms of disease. 80   - Normal activity with effort; some signs or symptoms of disease. 28   - Cares for self; unable to carry on normal activity or to do active work. 60   - Requires occasional assistance, but is able to care for most of his personal needs. 50   - Requires considerable assistance and frequent  medical care. 91   - Disabled; requires special care and assistance. 19   - Severely disabled; hospital admission is indicated although death not imminent. 71   - Very sick; hospital admission necessary; active supportive treatment necessary. 10   - Moribund; fatal processes progressing rapidly. 0     - Dead  Karnofsky DA, Abelmann [place], Craver LS and Burchenal [facility] [phone]) The use of the nitrogen mustards in the palliative treatment of carcinoma: with particular reference to bronchogenic carcinoma Cancer 1 634-56  LABORATORY DATA:  Lab Results  Component Value Date   WBC 6.5 11/20/2017   HGB 15.1 11/20/2017   HCT 42.8 11/20/2017   MCV 94.7 11/20/2017   PLT 261 11/20/2017   Lab Results  Component Value Date   NA 139 11/20/2017   K 4.1 11/20/2017   CL 106 11/20/2017   CO2 24 11/20/2017   No results found for: ALT, AST, GGT, ALKPHOS, BILITOT   RADIOGRAPHY: No results found.    IMPRESSION/PLAN: This visit was conducted via Telephone to spare the patient unnecessary potential exposure in the healthcare setting during the current COVID-19 pandemic. 1. 86 y.o. gentleman with Stage T2a adenocarcinoma of the prostate with Gleason Score of 4+3, and PSA of 14.2. We discussed the patient's workup and outlined the nature of prostate cancer in this setting. The patient's T stage, Gleason's score, and PSA put him into the intermediate risk group. Accordingly he is a candidate for 5.5 weeks of external radiation with or without the use of concurrent ADT. We discussed the available radiation techniques, and focused on the details and logistics of delivery. We discussed and outlined the risks, benefits, short and long-term effects associated with radiotherapy and compared and contrasted these with prostatectomy. We discussed the role of SpaceOAR in reducing the rectal toxicity associated with radiotherapy. We also detailed the role of ADT in the treatment of intermediate risk prostate cancer and outlined  the associated side effects that could be expected with this therapy. We discussed that given his age, this may significantly affect his quality of life for minimal benefit.  He was encouraged to  ask questions that were answered to his stated satisfaction.  He prefers to avoid ADT unless absolutely necessary, in an effort to maintain his excellent QOL.  At the end of the conversation, the patient is interested in moving forward with 5.5 weeks of external beam therapy. After thorough discussion, the patient elected not to use ADT at this time and conserve use of this for the future if necessary. We will share our discussion with Dr. Diona Becker and make arrangements for fiducial markers and SpaceOAR gel placement, prior to simulation, to reduce rectal toxicity from radiotherapy. The patient appears to have a good understanding of his disease and our treatment recommendations which are of curative intent and is in agreement with the stated plan.  Therefore, we will move forward with treatment planning accordingly, in anticipation of beginning IMRT in the near future.  Given current concerns for patient exposure during the COVID-19 pandemic, this encounter was conducted via telephone. The patient was notified in advance and was offered a MyChart meeting to allow for face to face communication but unfortunately reported that he did not have the appropriate resources/technology to support such a visit and instead preferred to proceed with telephone consult. The patient has given verbal consent for this type of encounter. The time spent during this encounter was 60 minutes. The attendants for this meeting include Tyler Pita MD, Ashlyn Bruning PA-C, Katie [place], and patient, Alfonzia Maitre and his wife. During the encounter, Tyler Pita MD, Ashlyn Bruning PA-C, and scribe, Wilburn Mylar were located at [facility].  Patient, Revis Botz  and his wife were located at home.    Nicholos Johns, PA-C    Tyler Pita, MD  [facility] Direct Dial: [phone]  Fax: [phone] [email or web address]  Skype  [email or web address]  This document serves as a record of services personally performed by Tyler Pita, MD and Freeman Caldron, PA-C. It was created on their behalf by Wilburn Mylar, a trained medical scribe. The creation of this record is based on the scribe's personal observations and the provider's statements to them. This document has been checked and approved by the attending provider.

## 2020-01-29 NOTE — Progress Notes (Signed)
See progress note under physician encounter. 

## 2020-02-06 DIAGNOSIS — R69 Illness, unspecified: Secondary | ICD-10-CM | POA: Diagnosis not present

## 2020-02-07 ENCOUNTER — Encounter: Payer: Self-pay | Admitting: Medical Oncology

## 2020-02-07 NOTE — Progress Notes (Signed)
Spoke with patient to introduce myself as the prostate nurse navigator and discuss my role. I was unable to meet him 4/20 when he consulted with Dr. Tammi Klippel. He has chosen 5.5 weeks of radiation. He informed me he received call from [facility] today with appointment for gold marker/Space Oar gel 5/20. I explained that Enid Derry will contact him with CT simulation planning appointment. He voiced understanding and voiced his appreciation for follow up.

## 2020-02-18 DIAGNOSIS — H353232 Exudative age-related macular degeneration, bilateral, with inactive choroidal neovascularization: Secondary | ICD-10-CM | POA: Diagnosis not present

## 2020-02-18 DIAGNOSIS — Z961 Presence of intraocular lens: Secondary | ICD-10-CM | POA: Diagnosis not present

## 2020-02-18 DIAGNOSIS — H524 Presbyopia: Secondary | ICD-10-CM | POA: Diagnosis not present

## 2020-02-25 ENCOUNTER — Other Ambulatory Visit: Payer: Self-pay | Admitting: Urology

## 2020-02-25 DIAGNOSIS — C61 Malignant neoplasm of prostate: Secondary | ICD-10-CM

## 2020-02-27 ENCOUNTER — Telehealth: Payer: Self-pay | Admitting: *Deleted

## 2020-02-27 DIAGNOSIS — C61 Malignant neoplasm of prostate: Secondary | ICD-10-CM | POA: Diagnosis not present

## 2020-02-27 NOTE — Telephone Encounter (Signed)
Called patient to inform of sim for 03/10/20 @ [facility] and his MRI for 03-11-20 - arrival time- 6:30 pm @ [facility], spoke with patient and he is aware of these appts.

## 2020-03-06 ENCOUNTER — Telehealth: Payer: Self-pay | Admitting: *Deleted

## 2020-03-06 NOTE — Telephone Encounter (Signed)
CALLED PATIENT TO REMIND OF SIM FOR 03-10-20 AND HIS MRI FOR 03-11-20 @ [facility], LVM FOR A RETURN CALL

## 2020-03-10 ENCOUNTER — Ambulatory Visit
Admission: RE | Admit: 2020-03-10 | Discharge: 2020-03-10 | Disposition: A | Discharge status: Home / Self Care | Payer: Medicare HMO | Source: Ambulatory Visit | Attending: Radiation Oncology | Admitting: Radiation Oncology

## 2020-03-10 ENCOUNTER — Other Ambulatory Visit: Payer: Self-pay

## 2020-03-10 ENCOUNTER — Encounter: Payer: Self-pay | Admitting: Medical Oncology

## 2020-03-10 DIAGNOSIS — C61 Malignant neoplasm of prostate: Secondary | ICD-10-CM | POA: Diagnosis not present

## 2020-03-10 DIAGNOSIS — Z510 Encounter for antineoplastic radiation therapy: Secondary | ICD-10-CM | POA: Diagnosis not present

## 2020-03-11 ENCOUNTER — Ambulatory Visit (HOSPITAL_COMMUNITY)
Admission: RE | Admit: 2020-03-11 | Discharge: 2020-03-11 | Disposition: A | Discharge status: Home / Self Care | Payer: Medicare HMO | Source: Ambulatory Visit | Attending: Urology | Admitting: Urology

## 2020-03-11 DIAGNOSIS — C61 Malignant neoplasm of prostate: Secondary | ICD-10-CM

## 2020-03-14 NOTE — Progress Notes (Signed)
  Radiation Oncology         (336) [phone] ________________________________  Name: Jonathan Becker MRN: [id number]  Date: 03/10/2020  DOB: November 12, 1929  SIMULATION AND TREATMENT PLANNING NOTE    ICD-10-CM   1. Malignant neoplasm of prostate ([facility])  C61     DIAGNOSIS:  86 y.o. gentleman with Stage T2a adenocarcinoma of the prostate with Gleason score of 4+3, and PSA of 14.2.  NARRATIVE:  The patient was brought to the [facility].  Identity was confirmed.  All relevant records and images related to the planned course of therapy were reviewed.  The patient freely provided informed written consent to proceed with treatment after reviewing the details related to the planned course of therapy. The consent form was witnessed and verified by the simulation staff.  Then, the patient was set-up in a stable reproducible supine position for radiation therapy.  A vacuum lock pillow device was custom fabricated to position his legs in a reproducible immobilized position.  Then, I performed a urethrogram under sterile conditions to identify the prostatic apex.  CT images were obtained.  Surface markings were placed.  The CT images were loaded into the planning software.  Then the prostate target and avoidance structures including the rectum, bladder, bowel and hips were contoured.  Treatment planning then occurred.  The radiation prescription was entered and confirmed.  A total of one complex treatment devices was fabricated. I have requested : Intensity Modulated Radiotherapy (IMRT) is medically necessary for this case for the following reason:  Rectal sparing.Marland Kitchen  PLAN:  The patient will receive 70 Gy in 28 fractions.  ________________________________  Sheral Apley Tammi Klippel, M.D.

## 2020-03-16 DIAGNOSIS — C61 Malignant neoplasm of prostate: Secondary | ICD-10-CM | POA: Diagnosis not present

## 2020-03-16 DIAGNOSIS — Z510 Encounter for antineoplastic radiation therapy: Secondary | ICD-10-CM | POA: Diagnosis not present

## 2020-03-19 ENCOUNTER — Encounter: Payer: Self-pay | Admitting: Medical Oncology

## 2020-03-19 ENCOUNTER — Other Ambulatory Visit: Payer: Self-pay

## 2020-03-19 ENCOUNTER — Ambulatory Visit
Admission: RE | Admit: 2020-03-19 | Discharge: 2020-03-19 | Disposition: A | Discharge status: Home / Self Care | Payer: Medicare HMO | Source: Ambulatory Visit | Attending: Radiation Oncology | Admitting: Radiation Oncology

## 2020-03-19 DIAGNOSIS — C61 Malignant neoplasm of prostate: Secondary | ICD-10-CM | POA: Diagnosis not present

## 2020-03-19 DIAGNOSIS — Z510 Encounter for antineoplastic radiation therapy: Secondary | ICD-10-CM | POA: Diagnosis not present

## 2020-03-20 ENCOUNTER — Ambulatory Visit
Admission: RE | Admit: 2020-03-20 | Discharge: 2020-03-20 | Disposition: A | Discharge status: Home / Self Care | Payer: Medicare HMO | Source: Ambulatory Visit | Attending: Radiation Oncology | Admitting: Radiation Oncology

## 2020-03-20 ENCOUNTER — Other Ambulatory Visit: Payer: Self-pay

## 2020-03-20 DIAGNOSIS — C61 Malignant neoplasm of prostate: Secondary | ICD-10-CM | POA: Diagnosis not present

## 2020-03-20 DIAGNOSIS — Z510 Encounter for antineoplastic radiation therapy: Secondary | ICD-10-CM | POA: Diagnosis not present

## 2020-03-23 ENCOUNTER — Ambulatory Visit
Admission: RE | Admit: 2020-03-23 | Discharge: 2020-03-23 | Disposition: A | Discharge status: Home / Self Care | Payer: Medicare HMO | Source: Ambulatory Visit | Attending: Radiation Oncology | Admitting: Radiation Oncology

## 2020-03-23 ENCOUNTER — Other Ambulatory Visit: Payer: Self-pay

## 2020-03-23 DIAGNOSIS — C61 Malignant neoplasm of prostate: Secondary | ICD-10-CM | POA: Diagnosis not present

## 2020-03-23 DIAGNOSIS — Z510 Encounter for antineoplastic radiation therapy: Secondary | ICD-10-CM | POA: Diagnosis not present

## 2020-03-24 ENCOUNTER — Other Ambulatory Visit: Payer: Self-pay

## 2020-03-24 ENCOUNTER — Ambulatory Visit
Admission: RE | Admit: 2020-03-24 | Discharge: 2020-03-24 | Disposition: A | Discharge status: Home / Self Care | Payer: Medicare HMO | Source: Ambulatory Visit | Attending: Radiation Oncology | Admitting: Radiation Oncology

## 2020-03-24 DIAGNOSIS — Z510 Encounter for antineoplastic radiation therapy: Secondary | ICD-10-CM | POA: Diagnosis not present

## 2020-03-24 DIAGNOSIS — C61 Malignant neoplasm of prostate: Secondary | ICD-10-CM | POA: Diagnosis not present

## 2020-03-25 ENCOUNTER — Ambulatory Visit
Admission: RE | Admit: 2020-03-25 | Discharge: 2020-03-25 | Disposition: A | Discharge status: Home / Self Care | Payer: Medicare HMO | Source: Ambulatory Visit | Attending: Radiation Oncology | Admitting: Radiation Oncology

## 2020-03-25 ENCOUNTER — Other Ambulatory Visit: Payer: Self-pay

## 2020-03-25 DIAGNOSIS — Z510 Encounter for antineoplastic radiation therapy: Secondary | ICD-10-CM | POA: Diagnosis not present

## 2020-03-25 DIAGNOSIS — C61 Malignant neoplasm of prostate: Secondary | ICD-10-CM | POA: Diagnosis not present

## 2020-03-26 ENCOUNTER — Other Ambulatory Visit: Payer: Self-pay

## 2020-03-26 ENCOUNTER — Ambulatory Visit
Admission: RE | Admit: 2020-03-26 | Discharge: 2020-03-26 | Disposition: A | Discharge status: Home / Self Care | Payer: Medicare HMO | Source: Ambulatory Visit | Attending: Radiation Oncology | Admitting: Radiation Oncology

## 2020-03-26 DIAGNOSIS — Z510 Encounter for antineoplastic radiation therapy: Secondary | ICD-10-CM | POA: Diagnosis not present

## 2020-03-26 DIAGNOSIS — C61 Malignant neoplasm of prostate: Secondary | ICD-10-CM | POA: Diagnosis not present

## 2020-03-27 ENCOUNTER — Ambulatory Visit
Admission: RE | Admit: 2020-03-27 | Discharge: 2020-03-27 | Disposition: A | Discharge status: Home / Self Care | Payer: Medicare HMO | Source: Ambulatory Visit | Attending: Radiation Oncology | Admitting: Radiation Oncology

## 2020-03-27 ENCOUNTER — Other Ambulatory Visit: Payer: Self-pay

## 2020-03-27 DIAGNOSIS — C61 Malignant neoplasm of prostate: Secondary | ICD-10-CM | POA: Diagnosis not present

## 2020-03-27 DIAGNOSIS — Z510 Encounter for antineoplastic radiation therapy: Secondary | ICD-10-CM | POA: Diagnosis not present

## 2020-03-30 ENCOUNTER — Other Ambulatory Visit: Payer: Self-pay

## 2020-03-30 ENCOUNTER — Ambulatory Visit
Admission: RE | Admit: 2020-03-30 | Discharge: 2020-03-30 | Disposition: A | Discharge status: Home / Self Care | Payer: Medicare HMO | Source: Ambulatory Visit | Attending: Radiation Oncology | Admitting: Radiation Oncology

## 2020-03-30 DIAGNOSIS — Z510 Encounter for antineoplastic radiation therapy: Secondary | ICD-10-CM | POA: Diagnosis not present

## 2020-03-30 DIAGNOSIS — C61 Malignant neoplasm of prostate: Secondary | ICD-10-CM | POA: Diagnosis not present

## 2020-03-31 ENCOUNTER — Ambulatory Visit
Admission: RE | Admit: 2020-03-31 | Discharge: 2020-03-31 | Disposition: A | Discharge status: Home / Self Care | Payer: Medicare HMO | Source: Ambulatory Visit | Attending: Radiation Oncology | Admitting: Radiation Oncology

## 2020-03-31 ENCOUNTER — Other Ambulatory Visit: Payer: Self-pay

## 2020-03-31 DIAGNOSIS — Z510 Encounter for antineoplastic radiation therapy: Secondary | ICD-10-CM | POA: Diagnosis not present

## 2020-03-31 DIAGNOSIS — C61 Malignant neoplasm of prostate: Secondary | ICD-10-CM | POA: Diagnosis not present

## 2020-04-01 ENCOUNTER — Other Ambulatory Visit: Payer: Self-pay

## 2020-04-01 ENCOUNTER — Ambulatory Visit
Admission: RE | Admit: 2020-04-01 | Discharge: 2020-04-01 | Disposition: A | Discharge status: Home / Self Care | Payer: Medicare HMO | Source: Ambulatory Visit | Attending: Radiation Oncology | Admitting: Radiation Oncology

## 2020-04-01 DIAGNOSIS — C61 Malignant neoplasm of prostate: Secondary | ICD-10-CM | POA: Diagnosis not present

## 2020-04-01 DIAGNOSIS — Z510 Encounter for antineoplastic radiation therapy: Secondary | ICD-10-CM | POA: Diagnosis not present

## 2020-04-02 ENCOUNTER — Other Ambulatory Visit: Payer: Self-pay

## 2020-04-02 ENCOUNTER — Ambulatory Visit
Admission: RE | Admit: 2020-04-02 | Discharge: 2020-04-02 | Disposition: A | Discharge status: Home / Self Care | Payer: Medicare HMO | Source: Ambulatory Visit | Attending: Radiation Oncology | Admitting: Radiation Oncology

## 2020-04-02 DIAGNOSIS — C61 Malignant neoplasm of prostate: Secondary | ICD-10-CM | POA: Diagnosis not present

## 2020-04-02 DIAGNOSIS — Z510 Encounter for antineoplastic radiation therapy: Secondary | ICD-10-CM | POA: Diagnosis not present

## 2020-04-03 ENCOUNTER — Ambulatory Visit
Admission: RE | Admit: 2020-04-03 | Discharge: 2020-04-03 | Disposition: A | Discharge status: Home / Self Care | Payer: Medicare HMO | Source: Ambulatory Visit | Attending: Radiation Oncology | Admitting: Radiation Oncology

## 2020-04-03 ENCOUNTER — Other Ambulatory Visit: Payer: Self-pay

## 2020-04-03 DIAGNOSIS — Z510 Encounter for antineoplastic radiation therapy: Secondary | ICD-10-CM | POA: Diagnosis not present

## 2020-04-03 DIAGNOSIS — C61 Malignant neoplasm of prostate: Secondary | ICD-10-CM | POA: Diagnosis not present

## 2020-04-06 ENCOUNTER — Encounter: Payer: Self-pay | Admitting: Cardiovascular Disease

## 2020-04-06 ENCOUNTER — Ambulatory Visit: Payer: Medicare HMO | Admitting: Cardiovascular Disease

## 2020-04-06 ENCOUNTER — Ambulatory Visit
Admission: RE | Admit: 2020-04-06 | Discharge: 2020-04-06 | Disposition: A | Discharge status: Home / Self Care | Payer: Medicare HMO | Source: Ambulatory Visit | Attending: Radiation Oncology | Admitting: Radiation Oncology

## 2020-04-06 ENCOUNTER — Other Ambulatory Visit: Payer: Self-pay

## 2020-04-06 VITALS — BP 120/56 | HR 59 | Ht 70.0 in | Wt 199.0 lb

## 2020-04-06 DIAGNOSIS — R0602 Shortness of breath: Secondary | ICD-10-CM

## 2020-04-06 DIAGNOSIS — I10 Essential (primary) hypertension: Secondary | ICD-10-CM | POA: Diagnosis not present

## 2020-04-06 DIAGNOSIS — Z510 Encounter for antineoplastic radiation therapy: Secondary | ICD-10-CM | POA: Diagnosis not present

## 2020-04-06 DIAGNOSIS — C61 Malignant neoplasm of prostate: Secondary | ICD-10-CM | POA: Diagnosis not present

## 2020-04-06 DIAGNOSIS — I700 Atherosclerosis of aorta: Secondary | ICD-10-CM | POA: Diagnosis not present

## 2020-04-06 NOTE — Patient Instructions (Signed)

## 2020-04-06 NOTE — Progress Notes (Signed)
Cardiology Office Note:    Date:  04/06/2020   ID:  Jonathan Becker, DOB 02-17-1930, MRN [id number]  PCP:  Shon Baton, MD  [facility] Cardiologist:  Sherren Mocha, MD  [facility] Electrophysiologist:  None   Referring MD: Shon Baton, MD   Chief Complaint  Patient presents with   Shortness of Breath    History of Present Illness:    Jonathan Becker is a 86 y.o. male with a hx of shortness of breath who initially underwent evaluation in 2020.  He was noted to have a background of hypertension and heart murmur.  He underwent evaluation that included a chest x-ray, echocardiogram, and myocardial perfusion study.  All of these were essentially unremarkable other than impaired LV relaxation and aortic sclerosis.  Myocardial perfusion was normal and there were no major valvular abnormalities.  LV function was normal.  Since I have seen the patient last he has been diagnosed with prostate cancer and is undergoing radiation therapy.  He is doing well. Still having some exertional dyspnea but no change over time. He is tolerating radiation so far without problems. He brings in home BP readings that are excellent. On average, BP is running 130/50-60 mmHg. No CP, orthopnea, or PND. He feels 'washed out' when his SBP is less than 120 mmHg.  Past Medical History:  Diagnosis Date   Essential hypertension 02/18/2019   Heart murmur    was told had slight heart murmur years ago   Hypertension    Shortness of breath 02/18/2019    Past Surgical History:  Procedure Laterality Date   EYE SURGERY     bilateral cataract surgery with lens implant   TONSILLECTOMY     age 18   UMBILICAL HERNIA REPAIR N/A 11/20/2017   Procedure: OPEN HERNIA REPAIR UMBILICAL ADULT;  Surgeon: Clovis Riley, MD;  Location: [facility];  Service: General;  Laterality: N/A;  Gen - LMA   VASECTOMY     WISDOM TOOTH EXTRACTION      Current Medications: Current Meds  Medication Sig   amLODipine  (NORVASC) 10 MG tablet Take 10 mg by mouth daily.    aspirin EC 81 MG tablet Take 81 mg by mouth daily.   Calcium Citrate 250 MG TABS Take 1 tablet by mouth every evening.   cholecalciferol (VITAMIN D) 1000 units tablet Take 1,000 Units by mouth daily.   hydrochlorothiazide (HYDRODIURIL) 25 MG tablet Take 25 mg by mouth daily.   irbesartan (AVAPRO) 150 MG tablet Take 150 mg by mouth daily.   magnesium gluconate (MAGONATE) 500 MG tablet Take 500 mg by mouth daily.   Multiple Vitamins-Minerals (ICAPS AREDS 2) CAPS Take 1 capsule by mouth 2 (two) times daily.   Multiple Vitamins-Minerals (MULTIVITAMIN WITH MINERALS) tablet Take 1 tablet by mouth daily.   Omega-3 1000 MG CAPS Take 1 capsule by mouth every evening.   tamsulosin (FLOMAX) 0.4 MG CAPS capsule Take 0.4 mg by mouth daily.     Allergies:   Clindamycin/lincomycin and Sulfa antibiotics   Social History   Socioeconomic History   Marital status: Married    Spouse name: Not on file   Number of children: Not on file   Years of education: Not on file   Highest education level: Not on file  Occupational History   Not on file  Tobacco Use   Smoking status: Never Smoker   Smokeless tobacco: Never Used  Vaping Use   Vaping Use: Never used  Substance and Sexual Activity  Alcohol use: Yes    Alcohol/week: 0.0 standard drinks    Comment: 1 glass liquor and 1 glass wine nightly   Drug use: No   Sexual activity: Not on file  Other Topics Concern   Not on file  Social History Narrative   Not on file   Social Determinants of Health   Financial Resource Strain:    Difficulty of Paying Living Expenses:   Food Insecurity:    Worried About Charity fundraiser in the Last Year:    Arboriculturist in the Last Year:   Transportation Needs:    Film/video editor (Medical):    Lack of Transportation (Non-Medical):   Physical Activity:    Days of Exercise per Week:    Minutes of Exercise per Session:    Stress:    Feeling of Stress :   Social Connections:    Frequency of Communication with Friends and Family:    Frequency of Social Gatherings with Friends and Family:    Attends Religious Services:    Active Member of Clubs or Organizations:    Attends Archivist Meetings:    Marital Status:      Family History: The patient's family history includes Heart attack in his father.  ROS:   Please see the history of present illness.    All other systems reviewed and are negative.  EKGs/Labs/Other Studies Reviewed:    The following studies were reviewed today: Myoview Scan 02/22/2019: Study Highlights   Nuclear stress EF: 66%. The left ventricular ejection fraction is hyperdynamic (>65%).  There was no ST segment deviation noted during stress.  This is a low risk study. No evidence of ischemia or previous infarction  The study is normal.  Echo 02/22/2019: IMPRESSIONS    1. The left ventricle has hyperdynamic systolic function, with an  ejection fraction of >65%. The cavity size was normal. There is moderately  increased left ventricular wall thickness. Left ventricular diastolic  Doppler parameters are consistent with  impaired relaxation. Elevated left ventricular end-diastolic pressure The  E/e' is 15.8.  2. The right ventricle has normal systolic function. The cavity was  normal. There is no increase in right ventricular wall thickness. Right  ventricular systolic pressure could not be assessed.  3. Mild calcification of the aortic valve. Aortic valve regurgitation is  trivial by color flow Doppler. No aortic valve stenosis.  4. No interatrial shunt detected.  5. The aortic root and ascending aorta are normal in size and structure.   EKG:  EKG is ordered today.  The ekg ordered today demonstrates normal sinus rhythm 59 bpm, within normal limits.  Recent Labs: No results found for requested labs within last 8760 hours.  Recent Lipid Panel No  results found for: CHOL, TRIG, HDL, CHOLHDL, VLDL, LDLCALC, LDLDIRECT  Physical Exam:    VS:  BP (!) 120/56    Pulse (!) 59    Ht 5\' 10"  (1.778 m)    Wt 199 lb (90.3 kg)    SpO2 95%    BMI 28.55 kg/m     Wt Readings from Last 3 Encounters:  04/06/20 199 lb (90.3 kg)  03/27/19 200 lb 3.2 oz (90.8 kg)  02/22/19 195 lb (88.5 kg)     GEN:  Well nourished, well developed in no acute distress HEENT: Normal NECK: No JVD; No carotid bruits LYMPHATICS: No lymphadenopathy CARDIAC: RRR, no murmurs, rubs, gallops RESPIRATORY:  Clear to auscultation without rales, wheezing or rhonchi  ABDOMEN:  Soft, non-tender, non-distended MUSCULOSKELETAL:  No edema; No deformity  SKIN: Warm and dry NEUROLOGIC:  Alert and oriented x 3 PSYCHIATRIC:  Normal affect   ASSESSMENT:    1. Essential hypertension   2. Shortness of breath   3. Aortic atherosclerosis (HCC)    PLAN:    In order of problems listed above:  1. Blood pressure readings reviewed and they are in an excellent range.  Current medicines will be continued.  Most recent labs reviewed.  Treated with HCTZ, irbesartan, and amlodipine. 2. Cardiac studies reviewed as above.  Suspect noncardiac symptoms. 3. Patient on aspirin 81 mg daily.  We discussed pros and cons of this and he will continue.    Medication Adjustments/Labs and Tests Ordered: Current medicines are reviewed at length with the patient today.  Concerns regarding medicines are outlined above.  Orders Placed This Encounter  Procedures   EKG 12-Lead   No orders of the defined types were placed in this encounter.   Patient Instructions  Medication Instructions:  Your provider recommends that you continue on your current medications as directed. Please refer to the Current Medication list given to you today.   *If you need a refill on your cardiac medications before your next appointment, please call your pharmacy*   Follow-Up: At [facility], you and your health needs  are our priority.  As part of our continuing mission to provide you with exceptional heart care, we have created designated Provider Care Teams.  These Care Teams include your primary Cardiologist (physician) and Advanced Practice Providers (APPs -  Physician Assistants and Nurse Practitioners) who all work together to provide you with the care you need, when you need it. Your next appointment:   12 month(s) The format for your next appointment:   In Person Provider:   You may see Sherren Mocha, MD or one of the following Advanced Practice Providers on your designated Care Team:    Richardson Dopp, PA-C  Robbie Lis, [place]      Signed, Sherren Mocha, MD  04/06/2020 2:40 PM    [place]

## 2020-04-07 ENCOUNTER — Other Ambulatory Visit: Payer: Self-pay

## 2020-04-07 ENCOUNTER — Ambulatory Visit
Admission: RE | Admit: 2020-04-07 | Discharge: 2020-04-07 | Disposition: A | Discharge status: Home / Self Care | Payer: Medicare HMO | Source: Ambulatory Visit | Attending: Radiation Oncology | Admitting: Radiation Oncology

## 2020-04-07 DIAGNOSIS — Z510 Encounter for antineoplastic radiation therapy: Secondary | ICD-10-CM | POA: Diagnosis not present

## 2020-04-07 DIAGNOSIS — C61 Malignant neoplasm of prostate: Secondary | ICD-10-CM | POA: Diagnosis not present

## 2020-04-08 ENCOUNTER — Other Ambulatory Visit: Payer: Self-pay

## 2020-04-08 ENCOUNTER — Ambulatory Visit
Admission: RE | Admit: 2020-04-08 | Discharge: 2020-04-08 | Disposition: A | Discharge status: Home / Self Care | Payer: Medicare HMO | Source: Ambulatory Visit | Attending: Radiation Oncology | Admitting: Radiation Oncology

## 2020-04-08 DIAGNOSIS — Z510 Encounter for antineoplastic radiation therapy: Secondary | ICD-10-CM | POA: Diagnosis not present

## 2020-04-08 DIAGNOSIS — C61 Malignant neoplasm of prostate: Secondary | ICD-10-CM | POA: Diagnosis not present

## 2020-04-09 ENCOUNTER — Ambulatory Visit
Admission: RE | Admit: 2020-04-09 | Discharge: 2020-04-09 | Disposition: A | Discharge status: Home / Self Care | Payer: Medicare HMO | Source: Ambulatory Visit | Attending: Radiation Oncology | Admitting: Radiation Oncology

## 2020-04-09 ENCOUNTER — Other Ambulatory Visit: Payer: Self-pay

## 2020-04-09 DIAGNOSIS — Z510 Encounter for antineoplastic radiation therapy: Secondary | ICD-10-CM | POA: Insufficient documentation

## 2020-04-09 DIAGNOSIS — C61 Malignant neoplasm of prostate: Secondary | ICD-10-CM | POA: Insufficient documentation

## 2020-04-10 ENCOUNTER — Other Ambulatory Visit: Payer: Self-pay

## 2020-04-10 ENCOUNTER — Ambulatory Visit
Admission: RE | Admit: 2020-04-10 | Discharge: 2020-04-10 | Disposition: A | Discharge status: Home / Self Care | Payer: Medicare HMO | Source: Ambulatory Visit | Attending: Radiation Oncology | Admitting: Radiation Oncology

## 2020-04-10 DIAGNOSIS — Z510 Encounter for antineoplastic radiation therapy: Secondary | ICD-10-CM | POA: Diagnosis not present

## 2020-04-10 DIAGNOSIS — C61 Malignant neoplasm of prostate: Secondary | ICD-10-CM | POA: Diagnosis not present

## 2020-04-14 ENCOUNTER — Ambulatory Visit
Admission: RE | Admit: 2020-04-14 | Discharge: 2020-04-14 | Disposition: A | Discharge status: Home / Self Care | Payer: Medicare HMO | Source: Ambulatory Visit | Attending: Radiation Oncology | Admitting: Radiation Oncology

## 2020-04-14 ENCOUNTER — Other Ambulatory Visit: Payer: Self-pay

## 2020-04-14 DIAGNOSIS — C61 Malignant neoplasm of prostate: Secondary | ICD-10-CM | POA: Diagnosis not present

## 2020-04-14 DIAGNOSIS — Z510 Encounter for antineoplastic radiation therapy: Secondary | ICD-10-CM | POA: Diagnosis not present

## 2020-04-15 ENCOUNTER — Other Ambulatory Visit: Payer: Self-pay

## 2020-04-15 ENCOUNTER — Ambulatory Visit
Admission: RE | Admit: 2020-04-15 | Discharge: 2020-04-15 | Disposition: A | Discharge status: Home / Self Care | Payer: Medicare HMO | Source: Ambulatory Visit | Attending: Radiation Oncology | Admitting: Radiation Oncology

## 2020-04-15 DIAGNOSIS — Z510 Encounter for antineoplastic radiation therapy: Secondary | ICD-10-CM | POA: Diagnosis not present

## 2020-04-15 DIAGNOSIS — C61 Malignant neoplasm of prostate: Secondary | ICD-10-CM | POA: Diagnosis not present

## 2020-04-16 ENCOUNTER — Ambulatory Visit
Admission: RE | Admit: 2020-04-16 | Discharge: 2020-04-16 | Disposition: A | Discharge status: Home / Self Care | Payer: Medicare HMO | Source: Ambulatory Visit | Attending: Radiation Oncology | Admitting: Radiation Oncology

## 2020-04-16 DIAGNOSIS — C61 Malignant neoplasm of prostate: Secondary | ICD-10-CM | POA: Diagnosis not present

## 2020-04-16 DIAGNOSIS — Z510 Encounter for antineoplastic radiation therapy: Secondary | ICD-10-CM | POA: Diagnosis not present

## 2020-04-17 ENCOUNTER — Ambulatory Visit
Admission: RE | Admit: 2020-04-17 | Discharge: 2020-04-17 | Disposition: A | Discharge status: Home / Self Care | Payer: Medicare HMO | Source: Ambulatory Visit | Attending: Radiation Oncology | Admitting: Radiation Oncology

## 2020-04-17 ENCOUNTER — Other Ambulatory Visit: Payer: Self-pay

## 2020-04-17 DIAGNOSIS — C61 Malignant neoplasm of prostate: Secondary | ICD-10-CM | POA: Diagnosis not present

## 2020-04-17 DIAGNOSIS — Z510 Encounter for antineoplastic radiation therapy: Secondary | ICD-10-CM | POA: Diagnosis not present

## 2020-04-18 ENCOUNTER — Ambulatory Visit: Payer: Medicare HMO

## 2020-04-19 ENCOUNTER — Ambulatory Visit: Payer: Medicare HMO

## 2020-04-20 ENCOUNTER — Encounter: Payer: Self-pay | Admitting: Medical Oncology

## 2020-04-20 ENCOUNTER — Other Ambulatory Visit: Payer: Self-pay

## 2020-04-20 ENCOUNTER — Ambulatory Visit
Admission: RE | Admit: 2020-04-20 | Discharge: 2020-04-20 | Disposition: A | Discharge status: Home / Self Care | Payer: Medicare HMO | Source: Ambulatory Visit | Attending: Radiation Oncology | Admitting: Radiation Oncology

## 2020-04-20 DIAGNOSIS — C61 Malignant neoplasm of prostate: Secondary | ICD-10-CM | POA: Diagnosis not present

## 2020-04-20 DIAGNOSIS — Z510 Encounter for antineoplastic radiation therapy: Secondary | ICD-10-CM | POA: Diagnosis not present

## 2020-04-21 ENCOUNTER — Ambulatory Visit
Admission: RE | Admit: 2020-04-21 | Discharge: 2020-04-21 | Disposition: A | Discharge status: Home / Self Care | Payer: Medicare HMO | Source: Ambulatory Visit | Attending: Radiation Oncology | Admitting: Radiation Oncology

## 2020-04-21 ENCOUNTER — Other Ambulatory Visit: Payer: Self-pay

## 2020-04-21 DIAGNOSIS — C61 Malignant neoplasm of prostate: Secondary | ICD-10-CM | POA: Diagnosis not present

## 2020-04-21 DIAGNOSIS — Z510 Encounter for antineoplastic radiation therapy: Secondary | ICD-10-CM | POA: Diagnosis not present

## 2020-04-22 ENCOUNTER — Other Ambulatory Visit: Payer: Self-pay

## 2020-04-22 ENCOUNTER — Ambulatory Visit
Admission: RE | Admit: 2020-04-22 | Discharge: 2020-04-22 | Disposition: A | Discharge status: Home / Self Care | Payer: Medicare HMO | Source: Ambulatory Visit | Attending: Radiation Oncology | Admitting: Radiation Oncology

## 2020-04-22 DIAGNOSIS — C61 Malignant neoplasm of prostate: Secondary | ICD-10-CM | POA: Diagnosis not present

## 2020-04-22 DIAGNOSIS — Z510 Encounter for antineoplastic radiation therapy: Secondary | ICD-10-CM | POA: Diagnosis not present

## 2020-04-23 ENCOUNTER — Ambulatory Visit
Admission: RE | Admit: 2020-04-23 | Discharge: 2020-04-23 | Disposition: A | Discharge status: Home / Self Care | Payer: Medicare HMO | Source: Ambulatory Visit | Attending: Radiation Oncology | Admitting: Radiation Oncology

## 2020-04-23 ENCOUNTER — Other Ambulatory Visit: Payer: Self-pay

## 2020-04-23 DIAGNOSIS — Z510 Encounter for antineoplastic radiation therapy: Secondary | ICD-10-CM | POA: Diagnosis not present

## 2020-04-23 DIAGNOSIS — C61 Malignant neoplasm of prostate: Secondary | ICD-10-CM | POA: Diagnosis not present

## 2020-04-24 ENCOUNTER — Ambulatory Visit
Admission: RE | Admit: 2020-04-24 | Discharge: 2020-04-24 | Disposition: A | Discharge status: Home / Self Care | Payer: Medicare HMO | Source: Ambulatory Visit | Attending: Radiation Oncology | Admitting: Radiation Oncology

## 2020-04-24 ENCOUNTER — Other Ambulatory Visit: Payer: Self-pay

## 2020-04-24 DIAGNOSIS — C61 Malignant neoplasm of prostate: Secondary | ICD-10-CM | POA: Diagnosis not present

## 2020-04-24 DIAGNOSIS — Z510 Encounter for antineoplastic radiation therapy: Secondary | ICD-10-CM | POA: Diagnosis not present

## 2020-04-27 ENCOUNTER — Other Ambulatory Visit: Payer: Self-pay

## 2020-04-27 ENCOUNTER — Ambulatory Visit
Admission: RE | Admit: 2020-04-27 | Discharge: 2020-04-27 | Disposition: A | Discharge status: Home / Self Care | Payer: Medicare HMO | Source: Ambulatory Visit | Attending: Radiation Oncology | Admitting: Radiation Oncology

## 2020-04-27 DIAGNOSIS — Z510 Encounter for antineoplastic radiation therapy: Secondary | ICD-10-CM | POA: Diagnosis not present

## 2020-04-27 DIAGNOSIS — C61 Malignant neoplasm of prostate: Secondary | ICD-10-CM | POA: Diagnosis not present

## 2020-04-28 ENCOUNTER — Ambulatory Visit
Admission: RE | Admit: 2020-04-28 | Discharge: 2020-04-28 | Disposition: A | Discharge status: Home / Self Care | Payer: Medicare HMO | Source: Ambulatory Visit | Attending: Radiation Oncology | Admitting: Radiation Oncology

## 2020-04-28 ENCOUNTER — Other Ambulatory Visit: Payer: Self-pay

## 2020-04-28 ENCOUNTER — Encounter: Payer: Self-pay | Admitting: Radiation Oncology

## 2020-04-28 DIAGNOSIS — Z510 Encounter for antineoplastic radiation therapy: Secondary | ICD-10-CM | POA: Diagnosis not present

## 2020-04-28 DIAGNOSIS — C61 Malignant neoplasm of prostate: Secondary | ICD-10-CM | POA: Diagnosis not present

## 2020-04-30 ENCOUNTER — Encounter: Payer: Self-pay | Admitting: Medical Oncology

## 2020-06-01 ENCOUNTER — Telehealth: Payer: Self-pay

## 2020-06-01 ENCOUNTER — Encounter: Payer: Self-pay | Admitting: Urology

## 2020-06-01 ENCOUNTER — Other Ambulatory Visit: Payer: Self-pay

## 2020-06-01 NOTE — Telephone Encounter (Signed)
Spoke with patient in regards to telephone visit with Freeman Caldron PA on 06/03/20 at 3:00pm. Patient verbalized understanding of appointment date and time. Meaningful use, AUA and prostate questions were reviewed. TM

## 2020-06-01 NOTE — Progress Notes (Signed)
Patient has telephone visit with Ashlyn Bruning PA. Patient states nocturia 2-4 times per night. Patient denies dysuria or hematuria. Patient states no issues with bowel movements. Patient states urine stream is weak. Patient states that he feels like he is emptying his bladder completely and returning to the bathroom 2+ hours later. Patient states having urgency and will have some leakage if he holds his urine too long. Patient is currently on Flomax. Patient states that he does not have a follow-up appointment with [facility].

## 2020-06-03 ENCOUNTER — Ambulatory Visit
Admission: RE | Admit: 2020-06-03 | Discharge: 2020-06-03 | Disposition: A | Discharge status: Home / Self Care | Payer: Medicare HMO | Source: Ambulatory Visit | Attending: Urology | Admitting: Urology

## 2020-06-03 ENCOUNTER — Other Ambulatory Visit: Payer: Self-pay

## 2020-06-03 DIAGNOSIS — C61 Malignant neoplasm of prostate: Secondary | ICD-10-CM

## 2020-06-03 NOTE — Progress Notes (Signed)
Radiation Oncology         [phone] ________________________________  Name: Jonathan Becker MRN: [id number]  Date: 06/03/2020  DOB: 1929-11-07  Post Treatment Note  CC: Shon Baton, MD  Franchot Gallo, MD  Diagnosis:   86 y.o. gentleman with Stage T2a adenocarcinoma of the prostate with Gleason score of 4+3, and PSA of 14.2.     Interval Since Last Radiation:  5 weeks  03/19/20 - 04/28/20:  The prostate was treated to 70 Gy in 28 fractions of 2.5 Gy  Narrative:  I spoke with the patient to conduct his routine scheduled 1 month follow up visit via telephone to spare the patient unnecessary potential exposure in the healthcare setting during the current COVID-19 pandemic.  The patient was notified in advance and gave permission to proceed with this visit format.  He tolerated radiation treatment relatively well with some minor urinary irritation and modest fatigue.  He experienced mild dysuria, intermittency, occasional feelings of incomplete emptying, urgency and frequency.  He denied gross hematuria, fever, chills or incontinence.  He did not experience abdominal pain, nausea, vomiting, diarrhea or constipation.                              On review of systems, the patient states that he is doing quite well in general.  He reports gradual improvement in his LUTS but continues with nocturia 2-4 times per night, weak stream, urgency, intermittency and occasional feelings of incomplete bladder emptying.  He will also occasionally have small-volume incontinence if he postpones urination for too long.  He has continued taking Flomax twice daily as prescribed.  He specifically denies gross hematuria or dysuria.  He denies abdominal pain, nausea, vomiting, diarrhea or constipation.  He feels like his bowels are back to his baseline at this point.  He reports a healthy appetite and is maintaining his weight.  Overall, he is quite pleased with his progress to date.  ALLERGIES:  is allergic to  clindamycin/lincomycin and sulfa antibiotics.  Meds: Current Outpatient Medications  Medication Sig Dispense Refill  . amLODipine (NORVASC) 10 MG tablet Take 10 mg by mouth daily.     Marland Kitchen aspirin EC 81 MG tablet Take 81 mg by mouth daily.    . Calcium Citrate 250 MG TABS Take 1 tablet by mouth every evening.    . cholecalciferol (VITAMIN D) 1000 units tablet Take 1,000 Units by mouth daily.    . hydrochlorothiazide (HYDRODIURIL) 25 MG tablet Take 25 mg by mouth daily.    . irbesartan (AVAPRO) 150 MG tablet Take 150 mg by mouth daily.    . magnesium gluconate (MAGONATE) 500 MG tablet Take 500 mg by mouth daily.    . Multiple Vitamins-Minerals (ICAPS AREDS 2) CAPS Take 1 capsule by mouth 2 (two) times daily.    . Multiple Vitamins-Minerals (MULTIVITAMIN WITH MINERALS) tablet Take 1 tablet by mouth daily.    . Omega-3 1000 MG CAPS Take 1 capsule by mouth every evening.    . tamsulosin (FLOMAX) 0.4 MG CAPS capsule Take 0.4 mg by mouth in the morning and at bedtime.      No current facility-administered medications for this encounter.    Physical Findings:  vitals were not taken for this visit.   Unable to assess due to telephone follow-up visit format.  Lab Findings: Lab Results  Component Value Date   WBC 6.5 11/20/2017   HGB 15.1 11/20/2017   HCT 42.8  11/20/2017   MCV 94.7 11/20/2017   PLT 261 11/20/2017     Radiographic Findings: No results found.  Impression/Plan: 1. 86 y.o. gentleman with Stage T2a adenocarcinoma of the prostate with Gleason score of 4+3, and PSA of 14.2.      He will continue to follow up with urology for ongoing PSA determinations but does not currently have any scheduled follow-up visit with Dr. Diona Fanti to his knowledge. He understands what to expect with regards to PSA monitoring going forward. I will look forward to following his response to treatment via correspondence with urology, and would be happy to continue to participate in his care if clinically  indicated. I talked to the patient about what to expect in the future, including his risk for erectile dysfunction and rectal bleeding. I encouraged him to call or return to the office if he has any questions regarding his previous radiation or possible radiation side effects. He was comfortable with this plan and will follow up as needed.  Today, a comprehensive survivorship care plan and treatment summary was reviewed with the patient today detailing his prostate cancer diagnosis, treatment course, potential late/long-term effects of treatment, appropriate follow-up care with recommendations for the future, and patient education resources.  A copy of this summary, along with a letter will be sent to the patient's primary care provider via fax after today's visit.  2. Cancer screening:  Due to Mr. Bradeen's history and his age, he should receive screening for skin cancers and colon cancer.  The information and recommendations are listed on the patient's comprehensive care plan/treatment summary and were reviewed in detail with the patient.     3. Health maintenance and wellness promotion: Mr. Meissner was encouraged to consume 5-7 servings of fruits and vegetables per day. He was provided a copy of the "Nutrition Rainbow" handout, as well as the handout "Take Control of Your Health and [place]" from the [employer].  He was also encouraged to engage in moderate to vigorous exercise for 30 minutes per day most days of the week. Information was provided regarding the [facility] fitness program, which is designed for cancer survivors to help them become more physically fit after cancer treatments. We discussed that a healthy BMI is 18.5-24.9 and that maintaining a healthy weight reduces risk of cancer recurrences.  He was instructed to limit his alcohol consumption and continue to abstain from tobacco use.  Lastly, he was encouraged to use sunscreen and wear protective  clothing when in the sun.     4. Support services/counseling: It is not uncommon for this period of the patient's cancer care trajectory to be one of many emotions and stressors.  Mr. Lal was encouraged to take advantage of our many support services programs, support groups, and/or counseling in coping with his new life as a cancer survivor after completing anti-cancer treatment.  He was offered support today through active listening and expressive supportive counseling.  He was given information regarding our available services and encouraged to contact me with any questions or for help enrolling in any of our support group/programs.       Nicholos Johns, PA-C

## 2020-06-03 NOTE — Progress Notes (Signed)
  Radiation Oncology         [phone] ________________________________  Name: Judith Campillo MRN: [id number]  Date: 04/28/2020  DOB: 05-09-1930  End of Treatment Note  Diagnosis:   86 y.o. gentleman with Stage T2a adenocarcinoma of the prostate with Gleason score of 4+3, and PSA of 14.2.     Indication for treatment:  Curative, Definitive Radiotherapy       Radiation treatment dates:   03/19/20 - 04/28/20  Site/dose:   The prostate was treated to 70 Gy in 28 fractions of 2.5 Gy  Beams/energy:   The patient was treated with IMRT using volumetric arc therapy delivering 6 MV X-rays to clockwise and counterclockwise circumferential arcs with a 90 degree collimator offset to avoid dose scalloping.  Image guidance was performed with daily cone beam CT prior to each fraction to align to gold markers in the prostate and assure proper bladder and rectal fill volumes.  Immobilization was achieved with BodyFix custom mold.  Narrative: The patient tolerated radiation treatment relatively well with some minor urinary irritation and modest fatigue.  He experienced mild dysuria, intermittency, occasional feelings of incomplete emptying, urgency and frequency.  He denied gross hematuria, fever, chills or incontinence.  He did not experience abdominal pain, nausea, vomiting, diarrhea or constipation.  Plan: The patient has completed radiation treatment. He will return to radiation oncology clinic for routine followup in one month. I advised him to call or return sooner if he has any questions or concerns related to his recovery or treatment. ________________________________  Sheral Apley. Tammi Klippel, M.D.

## 2020-06-11 DIAGNOSIS — C61 Malignant neoplasm of prostate: Secondary | ICD-10-CM | POA: Diagnosis not present

## 2020-06-11 DIAGNOSIS — R351 Nocturia: Secondary | ICD-10-CM | POA: Diagnosis not present

## 2020-06-16 DIAGNOSIS — R69 Illness, unspecified: Secondary | ICD-10-CM | POA: Diagnosis not present

## 2020-07-08 DIAGNOSIS — R351 Nocturia: Secondary | ICD-10-CM | POA: Diagnosis not present

## 2020-07-08 DIAGNOSIS — C61 Malignant neoplasm of prostate: Secondary | ICD-10-CM | POA: Diagnosis not present

## 2020-07-09 DIAGNOSIS — L820 Inflamed seborrheic keratosis: Secondary | ICD-10-CM | POA: Diagnosis not present

## 2020-07-09 DIAGNOSIS — D1801 Hemangioma of skin and subcutaneous tissue: Secondary | ICD-10-CM | POA: Diagnosis not present

## 2020-07-09 DIAGNOSIS — L570 Actinic keratosis: Secondary | ICD-10-CM | POA: Diagnosis not present

## 2020-07-09 DIAGNOSIS — L814 Other melanin hyperpigmentation: Secondary | ICD-10-CM | POA: Diagnosis not present

## 2020-07-09 DIAGNOSIS — L821 Other seborrheic keratosis: Secondary | ICD-10-CM | POA: Diagnosis not present

## 2020-07-09 DIAGNOSIS — D485 Neoplasm of uncertain behavior of skin: Secondary | ICD-10-CM | POA: Diagnosis not present

## 2020-07-09 DIAGNOSIS — L986 Other infiltrative disorders of the skin and subcutaneous tissue: Secondary | ICD-10-CM | POA: Diagnosis not present

## 2020-07-09 DIAGNOSIS — Z85828 Personal history of other malignant neoplasm of skin: Secondary | ICD-10-CM | POA: Diagnosis not present

## 2020-07-24 DIAGNOSIS — E785 Hyperlipidemia, unspecified: Secondary | ICD-10-CM | POA: Diagnosis not present

## 2020-07-27 DIAGNOSIS — I10 Essential (primary) hypertension: Secondary | ICD-10-CM | POA: Diagnosis not present

## 2020-07-27 DIAGNOSIS — Z1152 Encounter for screening for COVID-19: Secondary | ICD-10-CM | POA: Diagnosis not present

## 2020-07-27 DIAGNOSIS — J3489 Other specified disorders of nose and nasal sinuses: Secondary | ICD-10-CM | POA: Diagnosis not present

## 2020-07-27 DIAGNOSIS — T7840XA Allergy, unspecified, initial encounter: Secondary | ICD-10-CM | POA: Diagnosis not present

## 2020-07-27 DIAGNOSIS — R059 Cough, unspecified: Secondary | ICD-10-CM | POA: Diagnosis not present

## 2020-08-07 DIAGNOSIS — R69 Illness, unspecified: Secondary | ICD-10-CM | POA: Diagnosis not present

## 2020-08-11 DIAGNOSIS — C61 Malignant neoplasm of prostate: Secondary | ICD-10-CM | POA: Diagnosis not present

## 2020-08-19 DIAGNOSIS — M65872 Other synovitis and tenosynovitis, left ankle and foot: Secondary | ICD-10-CM | POA: Diagnosis not present

## 2020-08-19 DIAGNOSIS — C61 Malignant neoplasm of prostate: Secondary | ICD-10-CM | POA: Diagnosis not present

## 2020-08-19 DIAGNOSIS — M19071 Primary osteoarthritis, right ankle and foot: Secondary | ICD-10-CM | POA: Diagnosis not present

## 2020-08-19 DIAGNOSIS — R351 Nocturia: Secondary | ICD-10-CM | POA: Diagnosis not present

## 2020-08-19 DIAGNOSIS — M792 Neuralgia and neuritis, unspecified: Secondary | ICD-10-CM | POA: Diagnosis not present

## 2020-08-19 DIAGNOSIS — M65871 Other synovitis and tenosynovitis, right ankle and foot: Secondary | ICD-10-CM | POA: Diagnosis not present

## 2020-08-19 DIAGNOSIS — M19072 Primary osteoarthritis, left ankle and foot: Secondary | ICD-10-CM | POA: Diagnosis not present

## 2020-09-09 DIAGNOSIS — I129 Hypertensive chronic kidney disease with stage 1 through stage 4 chronic kidney disease, or unspecified chronic kidney disease: Secondary | ICD-10-CM | POA: Diagnosis not present

## 2020-09-09 DIAGNOSIS — N1831 Chronic kidney disease, stage 3a: Secondary | ICD-10-CM | POA: Diagnosis not present

## 2020-09-10 DIAGNOSIS — Z125 Encounter for screening for malignant neoplasm of prostate: Secondary | ICD-10-CM | POA: Diagnosis not present

## 2020-09-10 DIAGNOSIS — E559 Vitamin D deficiency, unspecified: Secondary | ICD-10-CM | POA: Diagnosis not present

## 2020-09-10 DIAGNOSIS — E785 Hyperlipidemia, unspecified: Secondary | ICD-10-CM | POA: Diagnosis not present

## 2020-09-14 DIAGNOSIS — N1831 Chronic kidney disease, stage 3a: Secondary | ICD-10-CM | POA: Diagnosis not present

## 2020-09-14 DIAGNOSIS — E559 Vitamin D deficiency, unspecified: Secondary | ICD-10-CM | POA: Diagnosis not present

## 2020-09-14 DIAGNOSIS — R0602 Shortness of breath: Secondary | ICD-10-CM | POA: Diagnosis not present

## 2020-09-14 DIAGNOSIS — Z0000 Encounter for general adult medical examination without abnormal findings: Secondary | ICD-10-CM | POA: Diagnosis not present

## 2020-09-14 DIAGNOSIS — D692 Other nonthrombocytopenic purpura: Secondary | ICD-10-CM | POA: Diagnosis not present

## 2020-09-14 DIAGNOSIS — K5730 Diverticulosis of large intestine without perforation or abscess without bleeding: Secondary | ICD-10-CM | POA: Diagnosis not present

## 2020-09-14 DIAGNOSIS — Z1212 Encounter for screening for malignant neoplasm of rectum: Secondary | ICD-10-CM | POA: Diagnosis not present

## 2020-09-14 DIAGNOSIS — I5189 Other ill-defined heart diseases: Secondary | ICD-10-CM | POA: Diagnosis not present

## 2020-09-14 DIAGNOSIS — I10 Essential (primary) hypertension: Secondary | ICD-10-CM | POA: Diagnosis not present

## 2020-09-14 DIAGNOSIS — R82998 Other abnormal findings in urine: Secondary | ICD-10-CM | POA: Diagnosis not present

## 2020-09-14 DIAGNOSIS — R5383 Other fatigue: Secondary | ICD-10-CM | POA: Diagnosis not present

## 2020-09-14 DIAGNOSIS — M25551 Pain in right hip: Secondary | ICD-10-CM | POA: Diagnosis not present

## 2020-09-14 DIAGNOSIS — I129 Hypertensive chronic kidney disease with stage 1 through stage 4 chronic kidney disease, or unspecified chronic kidney disease: Secondary | ICD-10-CM | POA: Diagnosis not present

## 2020-09-30 DIAGNOSIS — M545 Low back pain, unspecified: Secondary | ICD-10-CM | POA: Diagnosis not present

## 2020-10-12 DIAGNOSIS — N1831 Chronic kidney disease, stage 3a: Secondary | ICD-10-CM | POA: Diagnosis not present

## 2020-10-12 DIAGNOSIS — I129 Hypertensive chronic kidney disease with stage 1 through stage 4 chronic kidney disease, or unspecified chronic kidney disease: Secondary | ICD-10-CM | POA: Diagnosis not present

## 2020-10-19 DIAGNOSIS — M5134 Other intervertebral disc degeneration, thoracic region: Secondary | ICD-10-CM | POA: Diagnosis not present

## 2020-10-19 DIAGNOSIS — M5032 Other cervical disc degeneration, mid-cervical region, unspecified level: Secondary | ICD-10-CM | POA: Diagnosis not present

## 2020-10-19 DIAGNOSIS — M9902 Segmental and somatic dysfunction of thoracic region: Secondary | ICD-10-CM | POA: Diagnosis not present

## 2020-10-19 DIAGNOSIS — M5136 Other intervertebral disc degeneration, lumbar region: Secondary | ICD-10-CM | POA: Diagnosis not present

## 2020-10-19 DIAGNOSIS — M9903 Segmental and somatic dysfunction of lumbar region: Secondary | ICD-10-CM | POA: Diagnosis not present

## 2020-10-19 DIAGNOSIS — M9901 Segmental and somatic dysfunction of cervical region: Secondary | ICD-10-CM | POA: Diagnosis not present

## 2020-10-20 DIAGNOSIS — I129 Hypertensive chronic kidney disease with stage 1 through stage 4 chronic kidney disease, or unspecified chronic kidney disease: Secondary | ICD-10-CM | POA: Diagnosis not present

## 2020-10-20 DIAGNOSIS — N1831 Chronic kidney disease, stage 3a: Secondary | ICD-10-CM | POA: Diagnosis not present

## 2020-10-21 DIAGNOSIS — M5136 Other intervertebral disc degeneration, lumbar region: Secondary | ICD-10-CM | POA: Diagnosis not present

## 2020-10-21 DIAGNOSIS — M9902 Segmental and somatic dysfunction of thoracic region: Secondary | ICD-10-CM | POA: Diagnosis not present

## 2020-10-21 DIAGNOSIS — M9903 Segmental and somatic dysfunction of lumbar region: Secondary | ICD-10-CM | POA: Diagnosis not present

## 2020-10-21 DIAGNOSIS — M5134 Other intervertebral disc degeneration, thoracic region: Secondary | ICD-10-CM | POA: Diagnosis not present

## 2020-10-21 DIAGNOSIS — M9901 Segmental and somatic dysfunction of cervical region: Secondary | ICD-10-CM | POA: Diagnosis not present

## 2020-10-21 DIAGNOSIS — M5032 Other cervical disc degeneration, mid-cervical region, unspecified level: Secondary | ICD-10-CM | POA: Diagnosis not present

## 2020-10-22 DIAGNOSIS — I129 Hypertensive chronic kidney disease with stage 1 through stage 4 chronic kidney disease, or unspecified chronic kidney disease: Secondary | ICD-10-CM | POA: Diagnosis not present

## 2020-10-22 DIAGNOSIS — N1831 Chronic kidney disease, stage 3a: Secondary | ICD-10-CM | POA: Diagnosis not present

## 2020-10-28 DIAGNOSIS — M9901 Segmental and somatic dysfunction of cervical region: Secondary | ICD-10-CM | POA: Diagnosis not present

## 2020-10-28 DIAGNOSIS — M5136 Other intervertebral disc degeneration, lumbar region: Secondary | ICD-10-CM | POA: Diagnosis not present

## 2020-10-28 DIAGNOSIS — M5134 Other intervertebral disc degeneration, thoracic region: Secondary | ICD-10-CM | POA: Diagnosis not present

## 2020-10-28 DIAGNOSIS — M5032 Other cervical disc degeneration, mid-cervical region, unspecified level: Secondary | ICD-10-CM | POA: Diagnosis not present

## 2020-10-28 DIAGNOSIS — M9902 Segmental and somatic dysfunction of thoracic region: Secondary | ICD-10-CM | POA: Diagnosis not present

## 2020-10-28 DIAGNOSIS — M9903 Segmental and somatic dysfunction of lumbar region: Secondary | ICD-10-CM | POA: Diagnosis not present

## 2020-11-02 DIAGNOSIS — M9902 Segmental and somatic dysfunction of thoracic region: Secondary | ICD-10-CM | POA: Diagnosis not present

## 2020-11-02 DIAGNOSIS — M5136 Other intervertebral disc degeneration, lumbar region: Secondary | ICD-10-CM | POA: Diagnosis not present

## 2020-11-02 DIAGNOSIS — M9901 Segmental and somatic dysfunction of cervical region: Secondary | ICD-10-CM | POA: Diagnosis not present

## 2020-11-02 DIAGNOSIS — M5134 Other intervertebral disc degeneration, thoracic region: Secondary | ICD-10-CM | POA: Diagnosis not present

## 2020-11-02 DIAGNOSIS — M9903 Segmental and somatic dysfunction of lumbar region: Secondary | ICD-10-CM | POA: Diagnosis not present

## 2020-11-02 DIAGNOSIS — M5032 Other cervical disc degeneration, mid-cervical region, unspecified level: Secondary | ICD-10-CM | POA: Diagnosis not present

## 2020-11-18 DIAGNOSIS — C61 Malignant neoplasm of prostate: Secondary | ICD-10-CM | POA: Diagnosis not present

## 2020-11-25 DIAGNOSIS — Z8546 Personal history of malignant neoplasm of prostate: Secondary | ICD-10-CM | POA: Diagnosis not present

## 2020-11-25 DIAGNOSIS — R351 Nocturia: Secondary | ICD-10-CM | POA: Diagnosis not present

## 2021-01-06 DIAGNOSIS — B078 Other viral warts: Secondary | ICD-10-CM | POA: Diagnosis not present

## 2021-01-06 DIAGNOSIS — D1801 Hemangioma of skin and subcutaneous tissue: Secondary | ICD-10-CM | POA: Diagnosis not present

## 2021-01-06 DIAGNOSIS — Z85828 Personal history of other malignant neoplasm of skin: Secondary | ICD-10-CM | POA: Diagnosis not present

## 2021-01-06 DIAGNOSIS — D485 Neoplasm of uncertain behavior of skin: Secondary | ICD-10-CM | POA: Diagnosis not present

## 2021-01-06 DIAGNOSIS — L720 Epidermal cyst: Secondary | ICD-10-CM | POA: Diagnosis not present

## 2021-01-06 DIAGNOSIS — L570 Actinic keratosis: Secondary | ICD-10-CM | POA: Diagnosis not present

## 2021-01-06 DIAGNOSIS — L308 Other specified dermatitis: Secondary | ICD-10-CM | POA: Diagnosis not present

## 2021-01-06 DIAGNOSIS — L821 Other seborrheic keratosis: Secondary | ICD-10-CM | POA: Diagnosis not present

## 2021-01-06 DIAGNOSIS — L309 Dermatitis, unspecified: Secondary | ICD-10-CM | POA: Diagnosis not present

## 2021-01-06 DIAGNOSIS — L814 Other melanin hyperpigmentation: Secondary | ICD-10-CM | POA: Diagnosis not present

## 2021-01-06 DIAGNOSIS — D225 Melanocytic nevi of trunk: Secondary | ICD-10-CM | POA: Diagnosis not present

## 2021-03-15 DIAGNOSIS — Z0100 Encounter for examination of eyes and vision without abnormal findings: Secondary | ICD-10-CM | POA: Diagnosis not present

## 2021-03-16 DIAGNOSIS — R0602 Shortness of breath: Secondary | ICD-10-CM | POA: Diagnosis not present

## 2021-03-16 DIAGNOSIS — I5189 Other ill-defined heart diseases: Secondary | ICD-10-CM | POA: Diagnosis not present

## 2021-03-16 DIAGNOSIS — K5730 Diverticulosis of large intestine without perforation or abscess without bleeding: Secondary | ICD-10-CM | POA: Diagnosis not present

## 2021-03-16 DIAGNOSIS — N1831 Chronic kidney disease, stage 3a: Secondary | ICD-10-CM | POA: Diagnosis not present

## 2021-03-16 DIAGNOSIS — C61 Malignant neoplasm of prostate: Secondary | ICD-10-CM | POA: Diagnosis not present

## 2021-03-16 DIAGNOSIS — R001 Bradycardia, unspecified: Secondary | ICD-10-CM | POA: Diagnosis not present

## 2021-03-16 DIAGNOSIS — H3530 Unspecified macular degeneration: Secondary | ICD-10-CM | POA: Diagnosis not present

## 2021-03-16 DIAGNOSIS — D692 Other nonthrombocytopenic purpura: Secondary | ICD-10-CM | POA: Diagnosis not present

## 2021-03-16 DIAGNOSIS — I77810 Thoracic aortic ectasia: Secondary | ICD-10-CM | POA: Diagnosis not present

## 2021-03-16 DIAGNOSIS — I129 Hypertensive chronic kidney disease with stage 1 through stage 4 chronic kidney disease, or unspecified chronic kidney disease: Secondary | ICD-10-CM | POA: Diagnosis not present

## 2021-03-31 DIAGNOSIS — C61 Malignant neoplasm of prostate: Secondary | ICD-10-CM | POA: Diagnosis not present

## 2021-04-08 DIAGNOSIS — Z0100 Encounter for examination of eyes and vision without abnormal findings: Secondary | ICD-10-CM | POA: Diagnosis not present

## 2021-04-19 DIAGNOSIS — M5459 Other low back pain: Secondary | ICD-10-CM | POA: Diagnosis not present

## 2021-04-19 DIAGNOSIS — M545 Low back pain, unspecified: Secondary | ICD-10-CM | POA: Diagnosis not present

## 2021-04-20 ENCOUNTER — Other Ambulatory Visit: Payer: Self-pay | Admitting: Physician Assistant

## 2021-04-20 DIAGNOSIS — H5202 Hypermetropia, left eye: Secondary | ICD-10-CM | POA: Diagnosis not present

## 2021-04-20 DIAGNOSIS — M4317 Spondylolisthesis, lumbosacral region: Secondary | ICD-10-CM

## 2021-04-20 DIAGNOSIS — M5459 Other low back pain: Secondary | ICD-10-CM

## 2021-04-20 DIAGNOSIS — H353132 Nonexudative age-related macular degeneration, bilateral, intermediate dry stage: Secondary | ICD-10-CM | POA: Diagnosis not present

## 2021-04-20 DIAGNOSIS — Z961 Presence of intraocular lens: Secondary | ICD-10-CM | POA: Diagnosis not present

## 2021-04-21 ENCOUNTER — Other Ambulatory Visit: Payer: Self-pay

## 2021-04-21 ENCOUNTER — Ambulatory Visit
Admission: RE | Admit: 2021-04-21 | Discharge: 2021-04-21 | Disposition: A | Discharge status: Home / Self Care | Payer: Medicare HMO | Source: Ambulatory Visit | Attending: Physician Assistant | Admitting: Physician Assistant

## 2021-04-21 DIAGNOSIS — M48061 Spinal stenosis, lumbar region without neurogenic claudication: Secondary | ICD-10-CM | POA: Diagnosis not present

## 2021-04-21 DIAGNOSIS — Z0100 Encounter for examination of eyes and vision without abnormal findings: Secondary | ICD-10-CM | POA: Diagnosis not present

## 2021-04-21 DIAGNOSIS — M5459 Other low back pain: Secondary | ICD-10-CM

## 2021-04-21 DIAGNOSIS — M545 Low back pain, unspecified: Secondary | ICD-10-CM | POA: Diagnosis not present

## 2021-04-22 DIAGNOSIS — R351 Nocturia: Secondary | ICD-10-CM | POA: Diagnosis not present

## 2021-04-22 DIAGNOSIS — Z8546 Personal history of malignant neoplasm of prostate: Secondary | ICD-10-CM | POA: Diagnosis not present

## 2021-04-22 DIAGNOSIS — N401 Enlarged prostate with lower urinary tract symptoms: Secondary | ICD-10-CM | POA: Diagnosis not present

## 2021-05-05 DIAGNOSIS — M5136 Other intervertebral disc degeneration, lumbar region: Secondary | ICD-10-CM | POA: Diagnosis not present

## 2021-05-05 DIAGNOSIS — M545 Low back pain, unspecified: Secondary | ICD-10-CM | POA: Diagnosis not present

## 2021-05-05 DIAGNOSIS — M48062 Spinal stenosis, lumbar region with neurogenic claudication: Secondary | ICD-10-CM | POA: Diagnosis not present

## 2021-05-12 ENCOUNTER — Ambulatory Visit: Payer: Medicare HMO | Admitting: Physician Assistant

## 2021-05-12 ENCOUNTER — Other Ambulatory Visit: Payer: Self-pay

## 2021-05-12 ENCOUNTER — Encounter: Payer: Self-pay | Admitting: Physician Assistant

## 2021-05-12 VITALS — BP 124/60 | HR 50 | Ht 70.0 in | Wt 205.6 lb

## 2021-05-12 DIAGNOSIS — R0602 Shortness of breath: Secondary | ICD-10-CM

## 2021-05-12 DIAGNOSIS — I10 Essential (primary) hypertension: Secondary | ICD-10-CM | POA: Diagnosis not present

## 2021-05-12 DIAGNOSIS — M545 Low back pain, unspecified: Secondary | ICD-10-CM | POA: Diagnosis not present

## 2021-05-12 DIAGNOSIS — I700 Atherosclerosis of aorta: Secondary | ICD-10-CM | POA: Diagnosis not present

## 2021-05-12 NOTE — Progress Notes (Addendum)
Cardiology Office Note:    Date:  05/12/2021   ID:  Jonathan Becker, DOB Aug 26, 1930, MRN [id number]  PCP:  Shon Baton, MD  [facility] Cardiologist:  Sherren Mocha, MD  [facility] Electrophysiologist:  None   Chief Complaint: Yearly follow up   History of Present Illness:    Jonathan Becker is a 87 y.o. male with a hx of HTN and prostate cancer s/p radiation seen for follow up.   Establish care in 2020 for evaluation of shortness of breath.  He was found to have heart murmur and hypertension.  Stress test was normal.  Echocardiogram with normal LV function with impaired LV relaxation and aortic stenosis.  No major valvular abnormality.  Last seen by Dr. Burt Knack 03/2020.  Patient is here for follow-up.  He is dealing with severe lumbar pain with sciatica bilaterally.  He is going to start physical therapy today.  He does water aerobics 5 days/week and thinks may be overdoing it.  Denies chest pain, shortness of breath, orthopnea, PND, syncope, lower extremity edema or melena.   Past Medical History:  Diagnosis Date   Essential hypertension 02/18/2019   Heart murmur    was told had slight heart murmur years ago   Hypertension    Shortness of breath 02/18/2019    Past Surgical History:  Procedure Laterality Date   EYE SURGERY     bilateral cataract surgery with lens implant   TONSILLECTOMY     age 26   UMBILICAL HERNIA REPAIR N/A 11/20/2017   Procedure: OPEN HERNIA REPAIR UMBILICAL ADULT;  Surgeon: Clovis Riley, MD;  Location: [facility];  Service: General;  Laterality: N/A;  Gen - LMA   VASECTOMY     WISDOM TOOTH EXTRACTION      Current Medications: Current Outpatient Medications  Medication Instructions   amLODipine (NORVASC) 10 mg, Oral, Daily   aspirin EC 81 mg, Oral, Daily   Calcium Citrate 250 MG TABS 1 tablet, Oral, Every evening   cholecalciferol (VITAMIN D) 1,000 Units, Oral, Daily   hydrochlorothiazide (HYDRODIURIL) 25 mg, Oral, Daily   irbesartan  (AVAPRO) 300 mg, Oral, Daily   magnesium gluconate (MAGONATE) 500 mg, Oral, Daily   Multiple Vitamins-Minerals (ICAPS AREDS 2) CAPS 1 capsule, Oral, 2 times daily   Multiple Vitamins-Minerals (MULTIVITAMIN WITH MINERALS) tablet 1 tablet, Oral, Daily   Omega-3 1000 MG CAPS 1 capsule, Oral, Every evening   tamsulosin (FLOMAX) 0.4 mg, Oral, Daily     Allergies:   Clindamycin/lincomycin and Sulfa antibiotics   Social History   Socioeconomic History   Marital status: Married    Spouse name: Not on file   Number of children: Not on file   Years of education: Not on file   Highest education level: Not on file  Occupational History   Not on file  Tobacco Use   Smoking status: Never   Smokeless tobacco: Never  Vaping Use   Vaping Use: Never used  Substance and Sexual Activity   Alcohol use: Yes    Alcohol/week: 0.0 standard drinks    Comment: 1 glass liquor and 1 glass wine nightly   Drug use: No   Sexual activity: Not on file  Other Topics Concern   Not on file  Social History Narrative   Not on file   Social Determinants of Health   Financial Resource Strain: Not on file  Food Insecurity: Not on file  Transportation Needs: Not on file  Physical Activity: Not on file  Stress: Not on  file  Social Connections: Not on file     Family History: The patient's family history includes Heart attack in his father.    ROS:   Please see the history of present illness.    All other systems reviewed and are negative.   EKGs/Labs/Other Studies Reviewed:    The following studies were reviewed today:  Myoview Scan 02/22/2019: Study Highlights   Nuclear stress EF: 66%. The left ventricular ejection fraction is hyperdynamic (>65%). There was no ST segment deviation noted during stress. This is a low risk study. No evidence of ischemia or previous infarction The study is normal.   Echo 02/22/2019: IMPRESSIONS     1. The left ventricle has hyperdynamic systolic function, with an   ejection fraction of >65%. The cavity size was normal. There is moderately  increased left ventricular wall thickness. Left ventricular diastolic  Doppler parameters are consistent with  impaired relaxation. Elevated left ventricular end-diastolic pressure The  E/e' is 15.8.   2. The right ventricle has normal systolic function. The cavity was  normal. There is no increase in right ventricular wall thickness. Right  ventricular systolic pressure could not be assessed.   3. Mild calcification of the aortic valve. Aortic valve regurgitation is  trivial by color flow Doppler. No aortic valve stenosis.   4. No interatrial shunt detected.   5. The aortic root and ascending aorta are normal in size and structure  EKG:  EKG is  ordered today.  The ekg ordered today demonstrates sinus bradycardia  Recent Labs: No results found for requested labs within last 8760 hours.  Recent Lipid Panel No results found for: CHOL, TRIG, HDL, CHOLHDL, VLDL, LDLCALC, LDLDIRECT  Physical Exam:    VS:  BP 124/60   Pulse (!) 50   Ht '5\' 10"'$  (1.778 m)   Wt 205 lb 9.6 oz (93.3 kg)   SpO2 97%   BMI 29.50 kg/m     Wt Readings from Last 3 Encounters:  05/12/21 205 lb 9.6 oz (93.3 kg)  04/06/20 199 lb (90.3 kg)  03/27/19 200 lb 3.2 oz (90.8 kg)     GEN: Well nourished, well developed in no acute distress HEENT: Normal NECK: No JVD; No carotid bruits LYMPHATICS: No lymphadenopathy CARDIAC: RRR, no murmurs, rubs, gallops RESPIRATORY:  Clear to auscultation without rales, wheezing or rhonchi  ABDOMEN: Soft, non-tender, non-distended MUSCULOSKELETAL:  No edema; No deformity  SKIN: Warm and dry NEUROLOGIC:  Alert and oriented x 3 PSYCHIATRIC:  Normal affect   ASSESSMENT AND PLAN:    Hypertension -Blood pressure stable and controlled on current multi antihypertensive.  No change today.  2.  Chronic shortness of breath -This is stable.  Prior work-up as summarized above.   3.  Lumbar and sciatica  pain -He thinks he may be overdoing water aerobics. -He is going to start PT today.  Medication Adjustments/Labs and Tests Ordered: Current medicines are reviewed at length with the patient today.  Concerns regarding medicines are outlined above.  Orders Placed This Encounter  Procedures   EKG 12-Lead    No orders of the defined types were placed in this encounter.   Patient Instructions  Medication Instructions:  Your physician recommends that you continue on your current medications as directed. Please refer to the Current Medication list given to you today.  *If you need a refill on your cardiac medications before your next appointment, please call your pharmacy*   Lab Work: none If you have labs (blood work) drawn today  and your tests are completely normal, you will receive your results only by: MyChart Message (if you have MyChart) OR A paper copy in the mail If you have any lab test that is abnormal or we need to change your treatment, we will call you to review the results.   Testing/Procedures: none   Follow-Up: At [facility], you and your health needs are our priority.  As part of our continuing mission to provide you with exceptional heart care, we have created designated Provider Care Teams.  These Care Teams include your primary Cardiologist (physician) and Advanced Practice Providers (APPs -  Physician Assistants and Nurse Practitioners) who all work together to provide you with the care you need, when you need it.  We recommend signing up for the patient portal called "MyChart".  Sign up information is provided on this After Visit Summary.  MyChart is used to connect with patients for Virtual Visits (Telemedicine).  Patients are able to view lab/test results, encounter notes, upcoming appointments, etc.  Non-urgent messages can be sent to your provider as well.   To learn more about what you can do with MyChart, go to [email or web address].    Your next  appointment:   12 month(s)  The format for your next appointment:   In Person  Provider:   You may see Sherren Mocha, MD or one of the following Advanced Practice Providers on your designated Care Team:   Richardson Dopp, PA-C Robbie Lis, PA-C   Other Instructions    Signed, Leanor Kail, [place]  05/12/2021 10:16 AM    [place]

## 2021-05-12 NOTE — Patient Instructions (Signed)
Medication Instructions:  Your physician recommends that you continue on your current medications as directed. Please refer to the Current Medication list given to you today.  *If you need a refill on your cardiac medications before your next appointment, please call your pharmacy*   Lab Work: none If you have labs (blood work) drawn today and your tests are completely normal, you will receive your results only by: [place] (if you have MyChart) OR A paper copy in the mail If you have any lab test that is abnormal or we need to change your treatment, we will call you to review the results.   Testing/Procedures: none   Follow-Up: At [facility], you and your health needs are our priority.  As part of our continuing mission to provide you with exceptional heart care, we have created designated Provider Care Teams.  These Care Teams include your primary Cardiologist (physician) and Advanced Practice Providers (APPs -  Physician Assistants and Nurse Practitioners) who all work together to provide you with the care you need, when you need it.  We recommend signing up for the patient portal called "MyChart".  Sign up information is provided on this After Visit Summary.  MyChart is used to connect with patients for Virtual Visits (Telemedicine).  Patients are able to view lab/test results, encounter notes, upcoming appointments, etc.  Non-urgent messages can be sent to your provider as well.   To learn more about what you can do with MyChart, go to [email or web address].    Your next appointment:   12 month(s)  The format for your next appointment:   In Person  Provider:   You may see Sherren Mocha, MD or one of the following Advanced Practice Providers on your designated Care Team:   Richardson Dopp, PA-C Robbie Lis, [place]   Other Instructions

## 2021-05-17 DIAGNOSIS — M545 Low back pain, unspecified: Secondary | ICD-10-CM | POA: Diagnosis not present

## 2021-05-20 DIAGNOSIS — M545 Low back pain, unspecified: Secondary | ICD-10-CM | POA: Diagnosis not present

## 2021-05-24 DIAGNOSIS — M545 Low back pain, unspecified: Secondary | ICD-10-CM | POA: Diagnosis not present

## 2021-05-31 DIAGNOSIS — M545 Low back pain, unspecified: Secondary | ICD-10-CM | POA: Diagnosis not present

## 2021-06-04 DIAGNOSIS — M545 Low back pain, unspecified: Secondary | ICD-10-CM | POA: Diagnosis not present

## 2021-06-07 DIAGNOSIS — M545 Low back pain, unspecified: Secondary | ICD-10-CM | POA: Diagnosis not present

## 2021-06-24 DIAGNOSIS — M545 Low back pain, unspecified: Secondary | ICD-10-CM | POA: Diagnosis not present

## 2021-07-02 DIAGNOSIS — M545 Low back pain, unspecified: Secondary | ICD-10-CM | POA: Diagnosis not present

## 2021-07-07 DIAGNOSIS — M545 Low back pain, unspecified: Secondary | ICD-10-CM | POA: Diagnosis not present

## 2021-07-13 DIAGNOSIS — M545 Low back pain, unspecified: Secondary | ICD-10-CM | POA: Diagnosis not present

## 2021-07-16 DIAGNOSIS — L570 Actinic keratosis: Secondary | ICD-10-CM | POA: Diagnosis not present

## 2021-07-16 DIAGNOSIS — L720 Epidermal cyst: Secondary | ICD-10-CM | POA: Diagnosis not present

## 2021-07-16 DIAGNOSIS — Z85828 Personal history of other malignant neoplasm of skin: Secondary | ICD-10-CM | POA: Diagnosis not present

## 2021-07-16 DIAGNOSIS — L218 Other seborrheic dermatitis: Secondary | ICD-10-CM | POA: Diagnosis not present

## 2021-07-16 DIAGNOSIS — L820 Inflamed seborrheic keratosis: Secondary | ICD-10-CM | POA: Diagnosis not present

## 2021-07-16 DIAGNOSIS — L814 Other melanin hyperpigmentation: Secondary | ICD-10-CM | POA: Diagnosis not present

## 2021-07-16 DIAGNOSIS — D1801 Hemangioma of skin and subcutaneous tissue: Secondary | ICD-10-CM | POA: Diagnosis not present

## 2021-07-16 DIAGNOSIS — L821 Other seborrheic keratosis: Secondary | ICD-10-CM | POA: Diagnosis not present

## 2021-07-20 DIAGNOSIS — M545 Low back pain, unspecified: Secondary | ICD-10-CM | POA: Diagnosis not present

## 2021-07-23 IMAGING — MR MR LUMBAR SPINE W/O CM
4 of 5 series · 26 of 48 positions shown · non-contrast
Comparison: None.

CLINICAL DATA: Back and leg pain

EXAM:
MRI LUMBAR SPINE WITHOUT CONTRAST
TECHNIQUE: Multiplanar, multisequence MR imaging of the lumbar spine was
performed. No intravenous contrast was administered.

[Series 3: T2 · sagittal · 4.0mm · 1.17mm/px · 5 of 17 slices shown (1 of 2)]
[im 1/17]
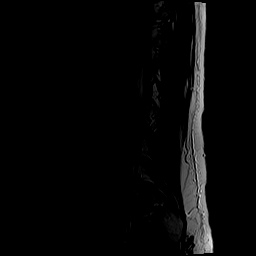
[im 5/17]
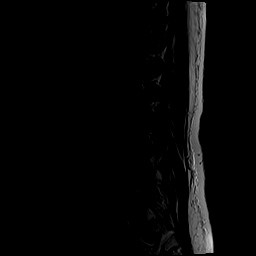
[im 9/17]
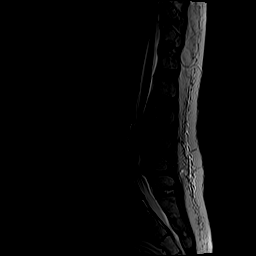
[im 13/17]
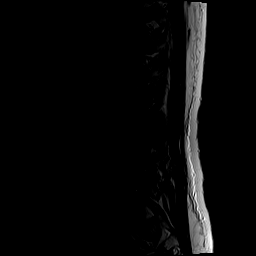
[im 17/17]
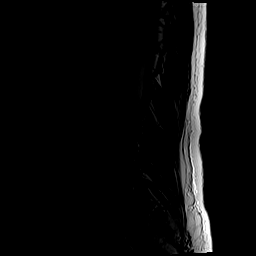

[Series 5: T1 · sagittal · 4.0mm · 1.17mm/px · 6 of 17 slices shown (1 of 2)]
[im 1/17]
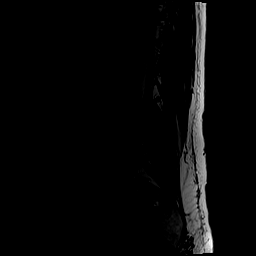
[im 4/17]
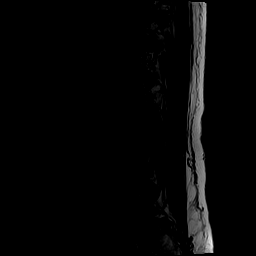
[im 7/17]
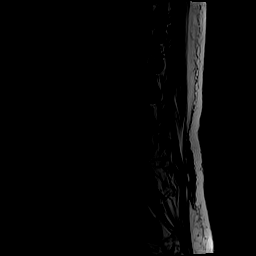
[im 10/17]
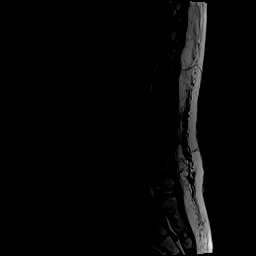
[im 13/17]
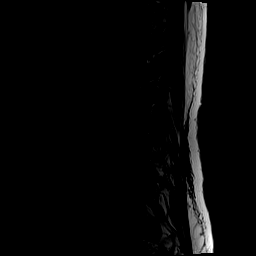
[im 17/17]
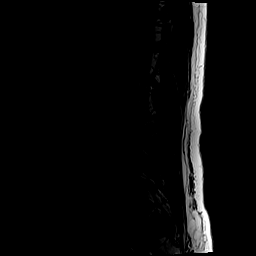

[Series 6: T2 · axial · 4.0mm · 0.39mm/px · z∈[-51,+171]mm · 10 of 47 slices shown (2 of 2)]
[im 4/47]
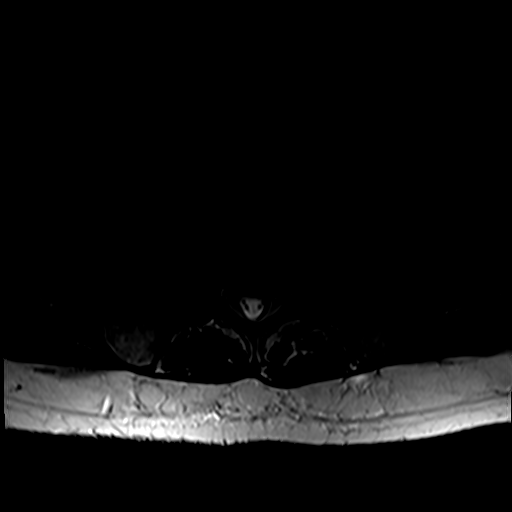
[im 7/47]
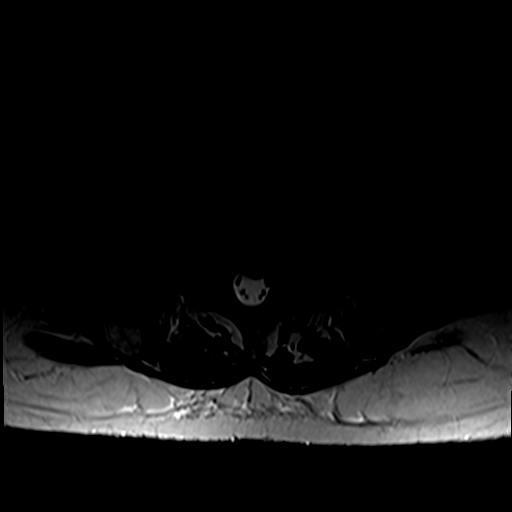
[im 10/47]
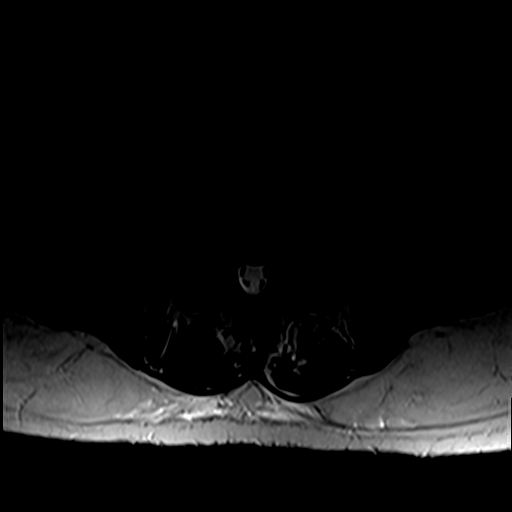
[im 16/47]
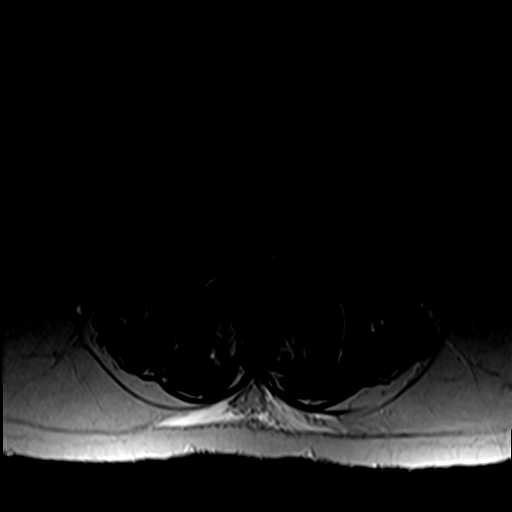
[im 22/47]
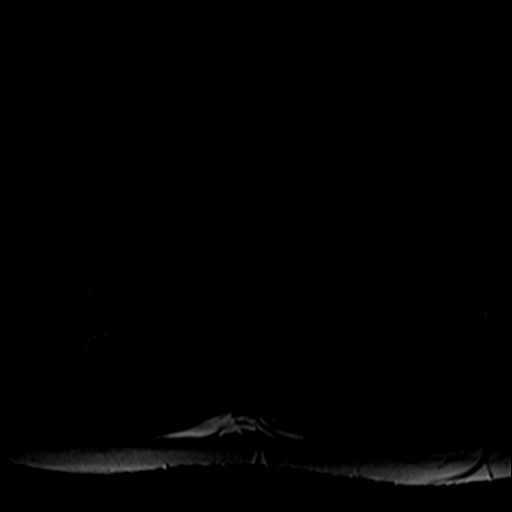
[im 25/47]
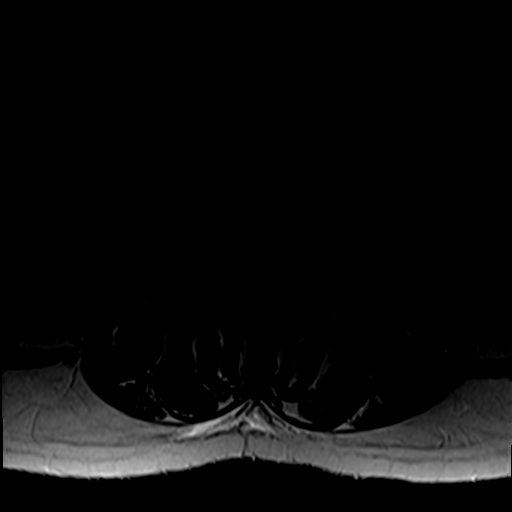
[im 28/47]
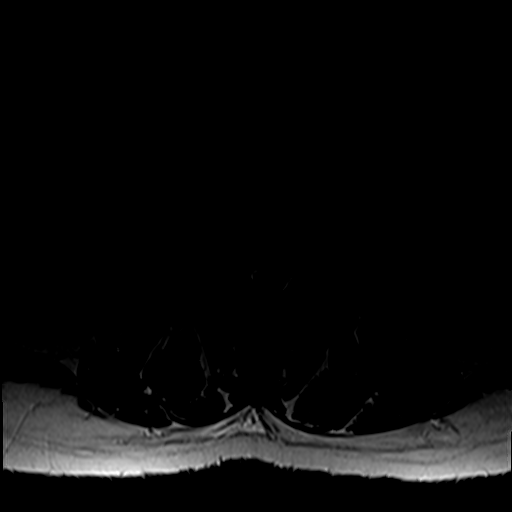
[im 34/47]
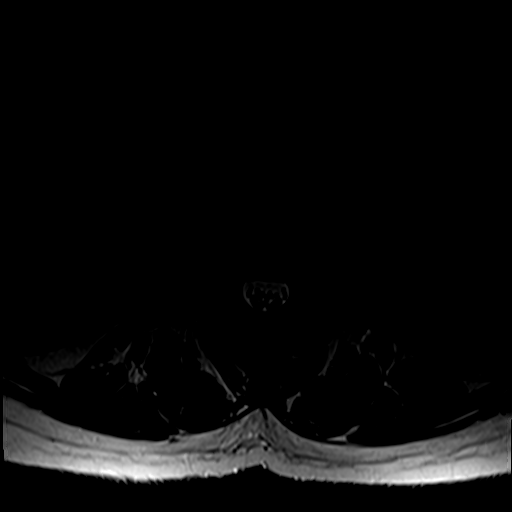
[im 40/47]
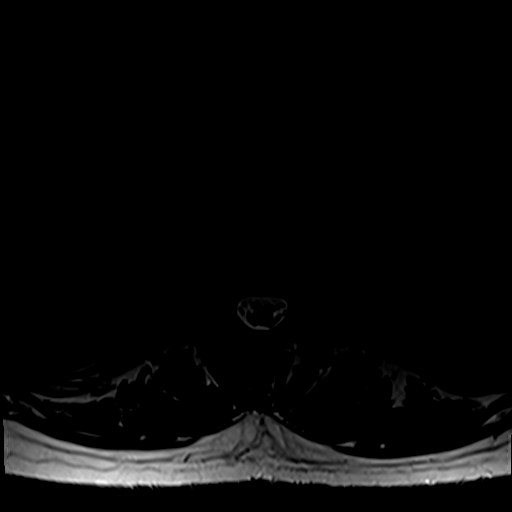
[im 47/47]
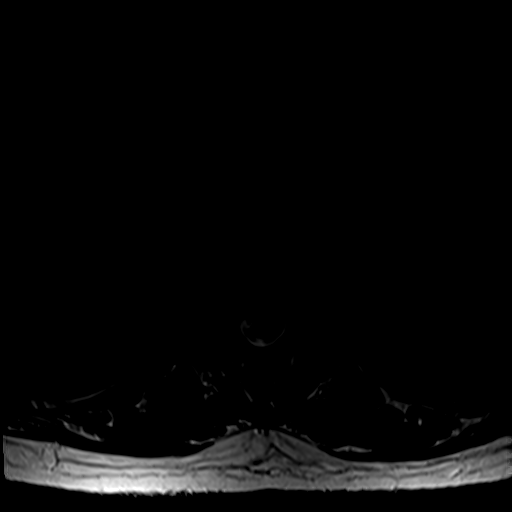

[Series 7: T1 · axial · 4.0mm · 0.39mm/px · z∈[-51,+138]mm · 5 of 47 slices shown (2 of 2)]
[im 4/47]
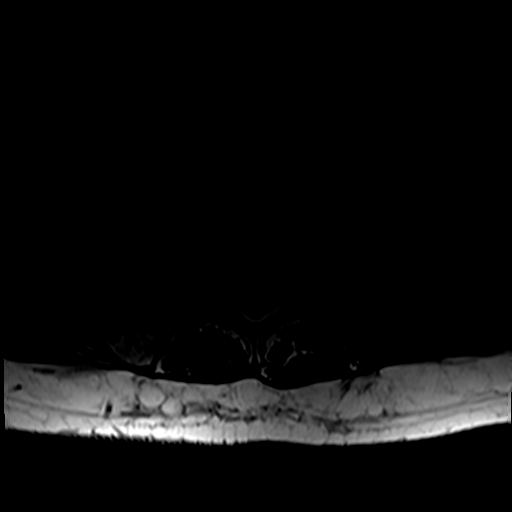
[im 7/47]
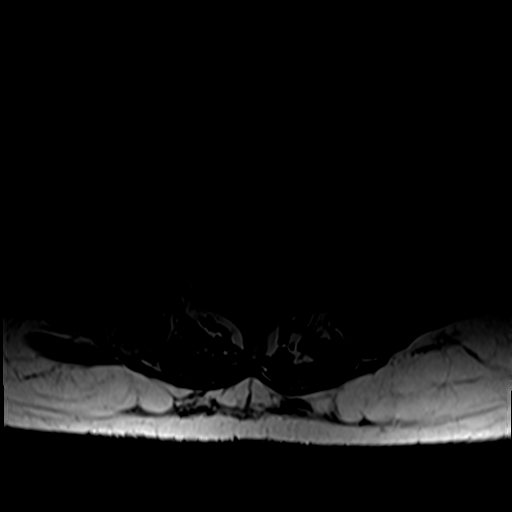
[im 10/47]
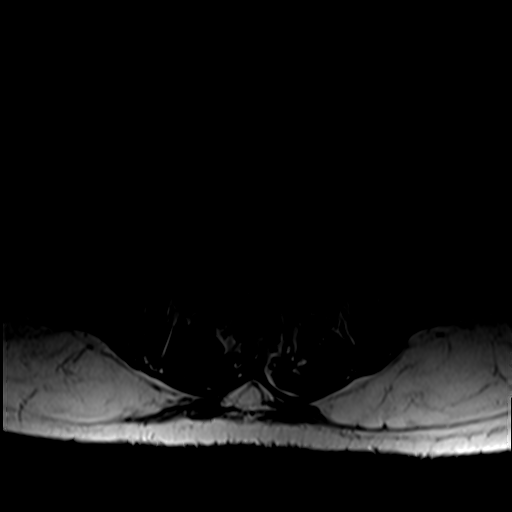
[im 25/47]
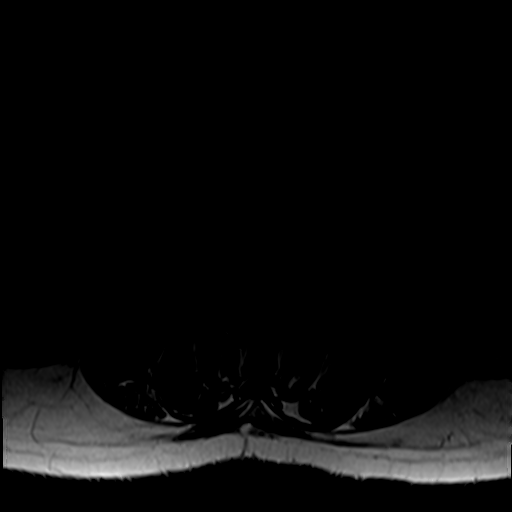
[im 40/47]
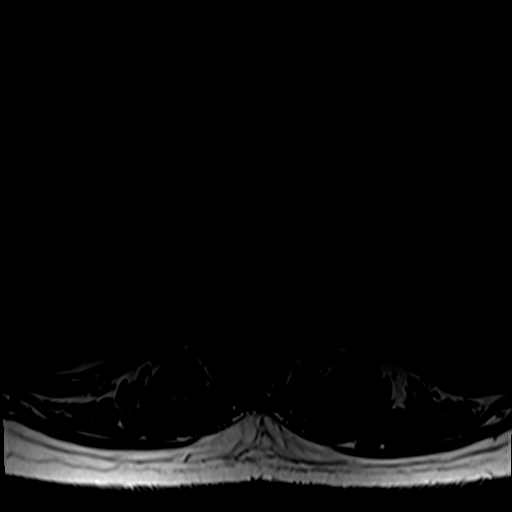

[26 of 48 positions shown; findings below may reference images not displayed]

FINDINGS: Segmentation: Transitional anatomy at the lumbosacral junction.
Assuming hypoplastic ribs at T12 as present on prior CT abdomen,
there is sacralization of L5.

Alignment:  No significant listhesis.

Vertebrae: Minor degenerative plate irregularity. Vertebral body
heights are maintained. Chronic appearing degenerative endplate
marrow changes. No substantial marrow edema. No suspicious osseous
lesion.

Conus medullaris and cauda equina: Conus extends to the T12-L1
level. Conus and cauda equina appear normal.

Paraspinal and other soft tissues: Unremarkable.

Disc levels: Congenital narrowing of the spinal canal.

L1-L2: Disc bulge. Mild facet arthropathy with ligamentum flavum
infolding. Mild to moderate canal stenosis. Slight effacement of
subarticular recesses. Mild foraminal stenosis.

L2-L3: Disc bulge with endplate osteophytic ridging. Mild facet
arthropathy with ligamentum flavum infolding. Moderate to marked
canal stenosis. Effacement of subarticular recesses. Mild to
moderate foraminal stenosis.

L3-L4: Disc bulge with endplate osteophytic ridging. Moderate facet
arthropathy with ligamentum flavum infolding. Moderate to marked
canal stenosis. Effacement of subarticular recesses. Moderate right
and mild to moderate left foraminal stenosis.

L4-L5: Disc bulge with endplate osteophytic ridging. Moderate facet
arthropathy with ligamentum flavum infolding. Mild canal stenosis.
Partial effacement of right greater than left subarticular recesses.
Mild right and mild to moderate left foraminal stenosis.

L5-S1:  Hypoplastic disc.  No canal or foraminal stenosis.
IMPRESSION: Multilevel degenerative changes superimposed on congenital canal
narrowing as detailed above. Canal stenosis is greatest at L2-L3 and
L3-L4 with narrowing of the subarticular recesses. Multilevel
foraminal narrowing.

## 2021-08-06 DIAGNOSIS — C61 Malignant neoplasm of prostate: Secondary | ICD-10-CM | POA: Diagnosis not present

## 2021-08-13 DIAGNOSIS — R351 Nocturia: Secondary | ICD-10-CM | POA: Diagnosis not present

## 2021-08-13 DIAGNOSIS — R3914 Feeling of incomplete bladder emptying: Secondary | ICD-10-CM | POA: Diagnosis not present

## 2021-08-13 DIAGNOSIS — C61 Malignant neoplasm of prostate: Secondary | ICD-10-CM | POA: Diagnosis not present

## 2021-08-13 DIAGNOSIS — N401 Enlarged prostate with lower urinary tract symptoms: Secondary | ICD-10-CM | POA: Diagnosis not present

## 2021-08-13 DIAGNOSIS — R3915 Urgency of urination: Secondary | ICD-10-CM | POA: Diagnosis not present

## 2021-08-24 DIAGNOSIS — H353132 Nonexudative age-related macular degeneration, bilateral, intermediate dry stage: Secondary | ICD-10-CM | POA: Diagnosis not present

## 2021-09-20 DIAGNOSIS — E785 Hyperlipidemia, unspecified: Secondary | ICD-10-CM | POA: Diagnosis not present

## 2021-09-20 DIAGNOSIS — Z125 Encounter for screening for malignant neoplasm of prostate: Secondary | ICD-10-CM | POA: Diagnosis not present

## 2021-09-20 DIAGNOSIS — I10 Essential (primary) hypertension: Secondary | ICD-10-CM | POA: Diagnosis not present

## 2021-09-20 DIAGNOSIS — E559 Vitamin D deficiency, unspecified: Secondary | ICD-10-CM | POA: Diagnosis not present

## 2021-09-27 DIAGNOSIS — I10 Essential (primary) hypertension: Secondary | ICD-10-CM | POA: Diagnosis not present

## 2021-09-27 DIAGNOSIS — E559 Vitamin D deficiency, unspecified: Secondary | ICD-10-CM | POA: Diagnosis not present

## 2021-09-27 DIAGNOSIS — N401 Enlarged prostate with lower urinary tract symptoms: Secondary | ICD-10-CM | POA: Diagnosis not present

## 2021-09-27 DIAGNOSIS — C61 Malignant neoplasm of prostate: Secondary | ICD-10-CM | POA: Diagnosis not present

## 2021-09-27 DIAGNOSIS — R0602 Shortness of breath: Secondary | ICD-10-CM | POA: Diagnosis not present

## 2021-09-27 DIAGNOSIS — E785 Hyperlipidemia, unspecified: Secondary | ICD-10-CM | POA: Diagnosis not present

## 2021-09-27 DIAGNOSIS — I129 Hypertensive chronic kidney disease with stage 1 through stage 4 chronic kidney disease, or unspecified chronic kidney disease: Secondary | ICD-10-CM | POA: Diagnosis not present

## 2021-09-27 DIAGNOSIS — N1831 Chronic kidney disease, stage 3a: Secondary | ICD-10-CM | POA: Diagnosis not present

## 2021-09-27 DIAGNOSIS — M25551 Pain in right hip: Secondary | ICD-10-CM | POA: Diagnosis not present

## 2021-09-27 DIAGNOSIS — Z0000 Encounter for general adult medical examination without abnormal findings: Secondary | ICD-10-CM | POA: Diagnosis not present

## 2021-09-27 DIAGNOSIS — R82998 Other abnormal findings in urine: Secondary | ICD-10-CM | POA: Diagnosis not present

## 2021-09-27 DIAGNOSIS — D692 Other nonthrombocytopenic purpura: Secondary | ICD-10-CM | POA: Diagnosis not present

## 2021-09-30 DIAGNOSIS — Z1212 Encounter for screening for malignant neoplasm of rectum: Secondary | ICD-10-CM | POA: Diagnosis not present

## 2021-10-08 DIAGNOSIS — M25552 Pain in left hip: Secondary | ICD-10-CM | POA: Diagnosis not present

## 2021-10-08 DIAGNOSIS — M7062 Trochanteric bursitis, left hip: Secondary | ICD-10-CM | POA: Diagnosis not present

## 2021-12-28 DIAGNOSIS — C61 Malignant neoplasm of prostate: Secondary | ICD-10-CM | POA: Diagnosis not present

## 2021-12-28 DIAGNOSIS — I5189 Other ill-defined heart diseases: Secondary | ICD-10-CM | POA: Diagnosis not present

## 2021-12-28 DIAGNOSIS — R600 Localized edema: Secondary | ICD-10-CM | POA: Diagnosis not present

## 2021-12-28 DIAGNOSIS — R059 Cough, unspecified: Secondary | ICD-10-CM | POA: Diagnosis not present

## 2021-12-28 DIAGNOSIS — I129 Hypertensive chronic kidney disease with stage 1 through stage 4 chronic kidney disease, or unspecified chronic kidney disease: Secondary | ICD-10-CM | POA: Diagnosis not present

## 2021-12-28 DIAGNOSIS — N1831 Chronic kidney disease, stage 3a: Secondary | ICD-10-CM | POA: Diagnosis not present

## 2021-12-28 DIAGNOSIS — I872 Venous insufficiency (chronic) (peripheral): Secondary | ICD-10-CM | POA: Diagnosis not present

## 2021-12-29 DIAGNOSIS — S32591A Other specified fracture of right pubis, initial encounter for closed fracture: Secondary | ICD-10-CM | POA: Diagnosis not present

## 2021-12-29 DIAGNOSIS — M25551 Pain in right hip: Secondary | ICD-10-CM | POA: Diagnosis not present

## 2022-01-12 DIAGNOSIS — M545 Low back pain, unspecified: Secondary | ICD-10-CM | POA: Diagnosis not present

## 2022-01-12 DIAGNOSIS — S32591A Other specified fracture of right pubis, initial encounter for closed fracture: Secondary | ICD-10-CM | POA: Diagnosis not present

## 2022-01-12 DIAGNOSIS — M25551 Pain in right hip: Secondary | ICD-10-CM | POA: Diagnosis not present

## 2022-01-26 DIAGNOSIS — D1801 Hemangioma of skin and subcutaneous tissue: Secondary | ICD-10-CM | POA: Diagnosis not present

## 2022-01-26 DIAGNOSIS — L820 Inflamed seborrheic keratosis: Secondary | ICD-10-CM | POA: Diagnosis not present

## 2022-01-26 DIAGNOSIS — D225 Melanocytic nevi of trunk: Secondary | ICD-10-CM | POA: Diagnosis not present

## 2022-01-26 DIAGNOSIS — D485 Neoplasm of uncertain behavior of skin: Secondary | ICD-10-CM | POA: Diagnosis not present

## 2022-01-26 DIAGNOSIS — L570 Actinic keratosis: Secondary | ICD-10-CM | POA: Diagnosis not present

## 2022-01-26 DIAGNOSIS — L814 Other melanin hyperpigmentation: Secondary | ICD-10-CM | POA: Diagnosis not present

## 2022-01-26 DIAGNOSIS — Z85828 Personal history of other malignant neoplasm of skin: Secondary | ICD-10-CM | POA: Diagnosis not present

## 2022-01-26 DIAGNOSIS — L821 Other seborrheic keratosis: Secondary | ICD-10-CM | POA: Diagnosis not present

## 2022-01-26 DIAGNOSIS — L309 Dermatitis, unspecified: Secondary | ICD-10-CM | POA: Diagnosis not present

## 2022-01-26 DIAGNOSIS — L720 Epidermal cyst: Secondary | ICD-10-CM | POA: Diagnosis not present

## 2022-02-09 DIAGNOSIS — C61 Malignant neoplasm of prostate: Secondary | ICD-10-CM | POA: Diagnosis not present

## 2022-02-16 DIAGNOSIS — N401 Enlarged prostate with lower urinary tract symptoms: Secondary | ICD-10-CM | POA: Diagnosis not present

## 2022-02-16 DIAGNOSIS — C61 Malignant neoplasm of prostate: Secondary | ICD-10-CM | POA: Diagnosis not present

## 2022-02-16 DIAGNOSIS — R351 Nocturia: Secondary | ICD-10-CM | POA: Diagnosis not present

## 2022-02-16 DIAGNOSIS — R972 Elevated prostate specific antigen [PSA]: Secondary | ICD-10-CM | POA: Diagnosis not present

## 2022-03-01 DIAGNOSIS — H353132 Nonexudative age-related macular degeneration, bilateral, intermediate dry stage: Secondary | ICD-10-CM | POA: Diagnosis not present

## 2022-03-01 DIAGNOSIS — H524 Presbyopia: Secondary | ICD-10-CM | POA: Diagnosis not present

## 2022-03-17 DIAGNOSIS — N401 Enlarged prostate with lower urinary tract symptoms: Secondary | ICD-10-CM | POA: Diagnosis not present

## 2022-03-17 DIAGNOSIS — C61 Malignant neoplasm of prostate: Secondary | ICD-10-CM | POA: Diagnosis not present

## 2022-03-17 DIAGNOSIS — I129 Hypertensive chronic kidney disease with stage 1 through stage 4 chronic kidney disease, or unspecified chronic kidney disease: Secondary | ICD-10-CM | POA: Diagnosis not present

## 2022-03-17 DIAGNOSIS — I872 Venous insufficiency (chronic) (peripheral): Secondary | ICD-10-CM | POA: Diagnosis not present

## 2022-03-17 DIAGNOSIS — D692 Other nonthrombocytopenic purpura: Secondary | ICD-10-CM | POA: Diagnosis not present

## 2022-03-17 DIAGNOSIS — R0602 Shortness of breath: Secondary | ICD-10-CM | POA: Diagnosis not present

## 2022-03-17 DIAGNOSIS — H6980 Other specified disorders of Eustachian tube, unspecified ear: Secondary | ICD-10-CM | POA: Diagnosis not present

## 2022-03-17 DIAGNOSIS — N1831 Chronic kidney disease, stage 3a: Secondary | ICD-10-CM | POA: Diagnosis not present

## 2022-03-17 DIAGNOSIS — R5383 Other fatigue: Secondary | ICD-10-CM | POA: Diagnosis not present

## 2022-03-17 DIAGNOSIS — R0981 Nasal congestion: Secondary | ICD-10-CM | POA: Diagnosis not present

## 2022-03-17 DIAGNOSIS — E871 Hypo-osmolality and hyponatremia: Secondary | ICD-10-CM | POA: Diagnosis not present

## 2022-03-24 DIAGNOSIS — B078 Other viral warts: Secondary | ICD-10-CM | POA: Diagnosis not present

## 2022-03-24 DIAGNOSIS — L820 Inflamed seborrheic keratosis: Secondary | ICD-10-CM | POA: Diagnosis not present

## 2022-03-24 DIAGNOSIS — L298 Other pruritus: Secondary | ICD-10-CM | POA: Diagnosis not present

## 2022-03-24 DIAGNOSIS — Z85828 Personal history of other malignant neoplasm of skin: Secondary | ICD-10-CM | POA: Diagnosis not present

## 2022-03-24 DIAGNOSIS — I8312 Varicose veins of left lower extremity with inflammation: Secondary | ICD-10-CM | POA: Diagnosis not present

## 2022-03-24 DIAGNOSIS — I872 Venous insufficiency (chronic) (peripheral): Secondary | ICD-10-CM | POA: Diagnosis not present

## 2022-03-24 DIAGNOSIS — I8311 Varicose veins of right lower extremity with inflammation: Secondary | ICD-10-CM | POA: Diagnosis not present

## 2022-04-15 DIAGNOSIS — L233 Allergic contact dermatitis due to drugs in contact with skin: Secondary | ICD-10-CM | POA: Diagnosis not present

## 2022-04-15 DIAGNOSIS — I872 Venous insufficiency (chronic) (peripheral): Secondary | ICD-10-CM | POA: Diagnosis not present

## 2022-04-15 DIAGNOSIS — I8312 Varicose veins of left lower extremity with inflammation: Secondary | ICD-10-CM | POA: Diagnosis not present

## 2022-04-15 DIAGNOSIS — I8311 Varicose veins of right lower extremity with inflammation: Secondary | ICD-10-CM | POA: Diagnosis not present

## 2022-06-29 ENCOUNTER — Ambulatory Visit: Payer: Medicare HMO | Attending: Cardiovascular Disease | Admitting: Cardiovascular Disease

## 2022-06-29 ENCOUNTER — Encounter: Payer: Self-pay | Admitting: Cardiovascular Disease

## 2022-06-29 VITALS — BP 120/70 | HR 52 | Ht 68.0 in | Wt 202.0 lb

## 2022-06-29 DIAGNOSIS — I10 Essential (primary) hypertension: Secondary | ICD-10-CM | POA: Diagnosis not present

## 2022-06-29 DIAGNOSIS — R0602 Shortness of breath: Secondary | ICD-10-CM

## 2022-06-29 DIAGNOSIS — I700 Atherosclerosis of aorta: Secondary | ICD-10-CM

## 2022-06-29 NOTE — Progress Notes (Signed)
Cardiology Office Note:    Date:  06/29/2022   ID:  Jonathan Becker, DOB 06/04/1930, MRN [id number]  PCP:  Shon Baton, [place] [facility] Cardiologist:  Sherren Mocha, MD     Referring MD: Shon Baton, MD   Chief Complaint  Patient presents with   Hypertension    History of Present Illness:    Jonathan Becker is a 88 y.o. male with a hx of hypertension, presenting for follow-up evaluation.  He was initially seen in 2020 when he was evaluated for shortness of breath.  The 2D echocardiogram demonstrated normal LV and RV function with no significant valvular abnormality.  He was noted to have impaired LV relaxation.  A myocardial perfusion scan showed no perfusion abnormalities.  The patient is here alone today.  He continues to live independently with his wife at [place].  He reports no recent change in symptoms.  He has fatigue and shortness of breath with activity.  This has not progressed over the past year or 2.  He is able to walk short distances but is very tired with moderate level activity.  He denies chest pain or pressure, heart palpitations, orthopnea, or PND.  He does have some mild leg swelling and wears compression stockings for this.  He brings in home blood pressure readings that show occasional low readings, especially after exercise.  He is currently managed with a combination of amlodipine, hydrochlorothiazide, irbesartan, and carvedilol.  Past Medical History:  Diagnosis Date   Essential hypertension 02/18/2019   Heart murmur    was told had slight heart murmur years ago   Hypertension    Shortness of breath 02/18/2019    Past Surgical History:  Procedure Laterality Date   EYE SURGERY     bilateral cataract surgery with lens implant   TONSILLECTOMY     age 80   UMBILICAL HERNIA REPAIR N/A 11/20/2017   Procedure: OPEN HERNIA REPAIR UMBILICAL ADULT;  Surgeon: Clovis Riley, MD;  Location: [facility];  Service: General;  Laterality: N/A;   Gen - LMA   VASECTOMY     WISDOM TOOTH EXTRACTION      Current Medications: Current Meds  Medication Sig   amLODipine (NORVASC) 10 MG tablet Take 10 mg by mouth daily.    aspirin EC 81 MG tablet Take 81 mg by mouth daily.   Calcium Citrate 250 MG TABS Take 1 tablet by mouth every evening.   cholecalciferol (VITAMIN D) 1000 units tablet Take 1,000 Units by mouth daily.   hydrochlorothiazide (HYDRODIURIL) 25 MG tablet Take 25 mg by mouth daily. Per patient taking 1/2 tablet daily   irbesartan (AVAPRO) 150 MG tablet Take 300 mg by mouth daily.   magnesium gluconate (MAGONATE) 500 MG tablet Take 500 mg by mouth daily.   Multiple Vitamins-Minerals (ICAPS AREDS 2) CAPS Take 1 capsule by mouth 2 (two) times daily.   Multiple Vitamins-Minerals (MULTIVITAMIN WITH MINERALS) tablet Take 1 tablet by mouth daily.   Omega-3 1000 MG CAPS Take 1 capsule by mouth every evening.   tamsulosin (FLOMAX) 0.4 MG CAPS capsule Take 0.4 mg by mouth every other day.   [DISCONTINUED] carvedilol (COREG) 3.125 MG tablet Take 3.125 mg by mouth 2 (two) times daily with a meal. Per patient taking 3 tablets every 2 days.     Allergies:   Clindamycin/lincomycin and Sulfa antibiotics   Social History   Socioeconomic History   Marital status: Married    Spouse name: Not on file  Number of children: Not on file   Years of education: Not on file   Highest education level: Not on file  Occupational History   Not on file  Tobacco Use   Smoking status: Never   Smokeless tobacco: Never  Vaping Use   Vaping Use: Never used  Substance and Sexual Activity   Alcohol use: Yes    Alcohol/week: 0.0 standard drinks of alcohol    Comment: 1 glass liquor and 1 glass wine nightly   Drug use: No   Sexual activity: Not on file  Other Topics Concern   Not on file  Social History Narrative   Not on file   Social Determinants of Health   Financial Resource Strain: Not on file  Food Insecurity: Not on file   Transportation Needs: Not on file  Physical Activity: Not on file  Stress: Not on file  Social Connections: Not on file     Family History: The patient's family history includes Heart attack in his father.  ROS:   Please see the history of present illness.    Positive for restless legs, All other systems reviewed and are negative.  EKGs/Labs/Other Studies Reviewed:    The following studies were reviewed today: Echo 02/22/2019: 1. The left ventricle has hyperdynamic systolic function, with an  ejection fraction of >65%. The cavity size was normal. There is moderately  increased left ventricular wall thickness. Left ventricular diastolic  Doppler parameters are consistent with  impaired relaxation. Elevated left ventricular end-diastolic pressure The  E/e' is 15.8.   2. The right ventricle has normal systolic function. The cavity was  normal. There is no increase in right ventricular wall thickness. Right  ventricular systolic pressure could not be assessed.   3. Mild calcification of the aortic valve. Aortic valve regurgitation is  trivial by color flow Doppler. No aortic valve stenosis.   4. No interatrial shunt detected.   5. The aortic root and ascending aorta are normal in size and structure.  Myocardial Perfusion Study 02/22/2019: Nuclear stress EF: 66%. The left ventricular ejection fraction is hyperdynamic (>65%). There was no ST segment deviation noted during stress. This is a low risk study. No evidence of ischemia or previous infarction The study is normal.  EKG:  EKG is ordered today.  The ekg ordered today demonstrates sinus bradycardia 52 bpm, otherwise within normal limits  Recent Labs: No results found for requested labs within last 365 days.  Recent Lipid Panel No results found for: "CHOL", "TRIG", "HDL", "CHOLHDL", "VLDL", "LDLCALC", "LDLDIRECT"   Risk Assessment/Calculations:                Physical Exam:    VS:  BP 120/70   Pulse (!) 52   Ht  '5\' 8"'$  (1.727 m)   Wt 202 lb (91.6 kg)   SpO2 95%   BMI 30.71 kg/m     Wt Readings from Last 3 Encounters:  06/29/22 202 lb (91.6 kg)  05/12/21 205 lb 9.6 oz (93.3 kg)  04/06/20 199 lb (90.3 kg)     GEN:  Well nourished, well developed elderly male who looks a decade younger than his stated agein no acute distress HEENT: Normal NECK: No JVD; No carotid bruits LYMPHATICS: No lymphadenopathy CARDIAC: RRR, no murmurs, rubs, gallops RESPIRATORY:  Clear to auscultation without rales, wheezing or rhonchi  ABDOMEN: Soft, non-tender, non-distended MUSCULOSKELETAL:  No edema; No deformity  SKIN: Warm and dry NEUROLOGIC:  Alert and oriented x 3 PSYCHIATRIC:  Normal affect  ASSESSMENT:    1. Essential hypertension   2. Aortic atherosclerosis ([place])   3. Shortness of breath    PLAN:    In order of problems listed above:  BP well-controlled on amlodipine, HCTZ, carvedilol, and irbesartan. He brings in home readings with elevated BP at times, and low BP at times with SBP < 100 mmHg mostly occurring post-exercise. He feels like 'a zombie' if his BP is under 120 mmHg. I asked him to stop taking carvedilol to allow his BP to consistently stay above 120 mmHg at his advanced age. He otherwise will continue current Rx and follow-up with Dr Virgina Jock as he has been doing.  On ASA 81 mg daily Chronic , likely deconditioning. On diuretic Rx in case diastolic dysfunction contributing.      Medication Adjustments/Labs and Tests Ordered: Current medicines are reviewed at length with the patient today.  Concerns regarding medicines are outlined above.  Orders Placed This Encounter  Procedures   EKG 12-Lead   No orders of the defined types were placed in this encounter.   Patient Instructions  Medication Instructions:  STOP Carvedilol (Coreg) *If you need a refill on your cardiac medications before your next appointment, please call your pharmacy*   Lab Work: NONE If you have labs (blood work)  drawn today and your tests are completely normal, you will receive your results only by: [place] (if you have MyChart) OR A paper copy in the mail If you have any lab test that is abnormal or we need to change your treatment, we will call you to review the results.   Testing/Procedures: NONE   Follow-Up: At [facility], you and your health needs are our priority.  As part of our continuing mission to provide you with exceptional heart care, we have created designated Provider Care Teams.  These Care Teams include your primary Cardiologist (physician) and Advanced Practice Providers (APPs -  Physician Assistants and Nurse Practitioners) who all work together to provide you with the care you need, when you need it.  We recommend signing up for the patient portal called "MyChart".  Sign up information is provided on this After Visit Summary.  MyChart is used to connect with patients for Virtual Visits (Telemedicine).  Patients are able to view lab/test results, encounter notes, upcoming appointments, etc.  Non-urgent messages can be sent to your provider as well.   To learn more about what you can do with MyChart, go to [email or web address].    Your next appointment:   1 year(s)  The format for your next appointment:   In Person  Provider:   Sherren Mocha, MD       Important Information About Sugar         Signed, Sherren Mocha, MD  06/29/2022 3:28 PM    [place]

## 2022-06-29 NOTE — Patient Instructions (Signed)
Medication Instructions:  STOP Carvedilol (Coreg) *If you need a refill on your cardiac medications before your next appointment, please call your pharmacy*   Lab Work: NONE If you have labs (blood work) drawn today and your tests are completely normal, you will receive your results only by: [place] (if you have MyChart) OR A paper copy in the mail If you have any lab test that is abnormal or we need to change your treatment, we will call you to review the results.   Testing/Procedures: NONE   Follow-Up: At [facility], you and your health needs are our priority.  As part of our continuing mission to provide you with exceptional heart care, we have created designated Provider Care Teams.  These Care Teams include your primary Cardiologist (physician) and Advanced Practice Providers (APPs -  Physician Assistants and Nurse Practitioners) who all work together to provide you with the care you need, when you need it.  We recommend signing up for the patient portal called "MyChart".  Sign up information is provided on this After Visit Summary.  MyChart is used to connect with patients for Virtual Visits (Telemedicine).  Patients are able to view lab/test results, encounter notes, upcoming appointments, etc.  Non-urgent messages can be sent to your provider as well.   To learn more about what you can do with MyChart, go to [email or web address].    Your next appointment:   1 year(s)  The format for your next appointment:   In Person  Provider:   Sherren Mocha, MD       Important Information About Sugar

## 2022-07-29 DIAGNOSIS — D1801 Hemangioma of skin and subcutaneous tissue: Secondary | ICD-10-CM | POA: Diagnosis not present

## 2022-07-29 DIAGNOSIS — D225 Melanocytic nevi of trunk: Secondary | ICD-10-CM | POA: Diagnosis not present

## 2022-07-29 DIAGNOSIS — L814 Other melanin hyperpigmentation: Secondary | ICD-10-CM | POA: Diagnosis not present

## 2022-07-29 DIAGNOSIS — L820 Inflamed seborrheic keratosis: Secondary | ICD-10-CM | POA: Diagnosis not present

## 2022-07-29 DIAGNOSIS — L570 Actinic keratosis: Secondary | ICD-10-CM | POA: Diagnosis not present

## 2022-07-29 DIAGNOSIS — B078 Other viral warts: Secondary | ICD-10-CM | POA: Diagnosis not present

## 2022-07-29 DIAGNOSIS — Z85828 Personal history of other malignant neoplasm of skin: Secondary | ICD-10-CM | POA: Diagnosis not present

## 2022-07-29 DIAGNOSIS — L821 Other seborrheic keratosis: Secondary | ICD-10-CM | POA: Diagnosis not present

## 2022-08-10 DIAGNOSIS — N401 Enlarged prostate with lower urinary tract symptoms: Secondary | ICD-10-CM | POA: Diagnosis not present

## 2022-08-10 DIAGNOSIS — C61 Malignant neoplasm of prostate: Secondary | ICD-10-CM | POA: Diagnosis not present

## 2022-08-10 DIAGNOSIS — R3915 Urgency of urination: Secondary | ICD-10-CM | POA: Diagnosis not present

## 2022-09-08 DIAGNOSIS — H353131 Nonexudative age-related macular degeneration, bilateral, early dry stage: Secondary | ICD-10-CM | POA: Diagnosis not present

## 2022-10-05 DIAGNOSIS — R5383 Other fatigue: Secondary | ICD-10-CM | POA: Diagnosis not present

## 2022-10-05 DIAGNOSIS — R972 Elevated prostate specific antigen [PSA]: Secondary | ICD-10-CM | POA: Diagnosis not present

## 2022-10-05 DIAGNOSIS — R7989 Other specified abnormal findings of blood chemistry: Secondary | ICD-10-CM | POA: Diagnosis not present

## 2022-10-05 DIAGNOSIS — E559 Vitamin D deficiency, unspecified: Secondary | ICD-10-CM | POA: Diagnosis not present

## 2022-10-05 DIAGNOSIS — E785 Hyperlipidemia, unspecified: Secondary | ICD-10-CM | POA: Diagnosis not present

## 2022-10-05 DIAGNOSIS — I10 Essential (primary) hypertension: Secondary | ICD-10-CM | POA: Diagnosis not present

## 2022-10-11 DIAGNOSIS — C61 Malignant neoplasm of prostate: Secondary | ICD-10-CM | POA: Diagnosis not present

## 2022-10-11 DIAGNOSIS — I5189 Other ill-defined heart diseases: Secondary | ICD-10-CM | POA: Diagnosis not present

## 2022-10-11 DIAGNOSIS — Z0000 Encounter for general adult medical examination without abnormal findings: Secondary | ICD-10-CM | POA: Diagnosis not present

## 2022-10-11 DIAGNOSIS — I77810 Thoracic aortic ectasia: Secondary | ICD-10-CM | POA: Diagnosis not present

## 2022-10-11 DIAGNOSIS — N1831 Chronic kidney disease, stage 3a: Secondary | ICD-10-CM | POA: Diagnosis not present

## 2022-10-11 DIAGNOSIS — R82998 Other abnormal findings in urine: Secondary | ICD-10-CM | POA: Diagnosis not present

## 2022-10-11 DIAGNOSIS — N401 Enlarged prostate with lower urinary tract symptoms: Secondary | ICD-10-CM | POA: Diagnosis not present

## 2022-10-11 DIAGNOSIS — H3530 Unspecified macular degeneration: Secondary | ICD-10-CM | POA: Diagnosis not present

## 2022-10-11 DIAGNOSIS — Z1339 Encounter for screening examination for other mental health and behavioral disorders: Secondary | ICD-10-CM | POA: Diagnosis not present

## 2022-10-11 DIAGNOSIS — R972 Elevated prostate specific antigen [PSA]: Secondary | ICD-10-CM | POA: Diagnosis not present

## 2022-10-11 DIAGNOSIS — I129 Hypertensive chronic kidney disease with stage 1 through stage 4 chronic kidney disease, or unspecified chronic kidney disease: Secondary | ICD-10-CM | POA: Diagnosis not present

## 2022-10-11 DIAGNOSIS — R0602 Shortness of breath: Secondary | ICD-10-CM | POA: Diagnosis not present

## 2022-10-11 DIAGNOSIS — Z1331 Encounter for screening for depression: Secondary | ICD-10-CM | POA: Diagnosis not present

## 2022-10-11 DIAGNOSIS — D692 Other nonthrombocytopenic purpura: Secondary | ICD-10-CM | POA: Diagnosis not present

## 2023-01-05 DIAGNOSIS — M160 Bilateral primary osteoarthritis of hip: Secondary | ICD-10-CM | POA: Diagnosis not present

## 2023-01-05 DIAGNOSIS — M5441 Lumbago with sciatica, right side: Secondary | ICD-10-CM | POA: Diagnosis not present

## 2023-01-10 DIAGNOSIS — M545 Low back pain, unspecified: Secondary | ICD-10-CM | POA: Diagnosis not present

## 2023-01-12 DIAGNOSIS — M5442 Lumbago with sciatica, left side: Secondary | ICD-10-CM | POA: Diagnosis not present

## 2023-01-12 DIAGNOSIS — M5441 Lumbago with sciatica, right side: Secondary | ICD-10-CM | POA: Diagnosis not present

## 2023-01-17 DIAGNOSIS — M48062 Spinal stenosis, lumbar region with neurogenic claudication: Secondary | ICD-10-CM | POA: Diagnosis not present

## 2023-02-14 DIAGNOSIS — M48062 Spinal stenosis, lumbar region with neurogenic claudication: Secondary | ICD-10-CM | POA: Diagnosis not present

## 2023-02-28 DIAGNOSIS — L570 Actinic keratosis: Secondary | ICD-10-CM | POA: Diagnosis not present

## 2023-02-28 DIAGNOSIS — L111 Transient acantholytic dermatosis [Grover]: Secondary | ICD-10-CM | POA: Diagnosis not present

## 2023-02-28 DIAGNOSIS — Z85828 Personal history of other malignant neoplasm of skin: Secondary | ICD-10-CM | POA: Diagnosis not present

## 2023-02-28 DIAGNOSIS — D485 Neoplasm of uncertain behavior of skin: Secondary | ICD-10-CM | POA: Diagnosis not present

## 2023-02-28 DIAGNOSIS — L821 Other seborrheic keratosis: Secondary | ICD-10-CM | POA: Diagnosis not present

## 2023-03-14 DIAGNOSIS — H353131 Nonexudative age-related macular degeneration, bilateral, early dry stage: Secondary | ICD-10-CM | POA: Diagnosis not present

## 2023-03-21 DIAGNOSIS — M48062 Spinal stenosis, lumbar region with neurogenic claudication: Secondary | ICD-10-CM | POA: Diagnosis not present

## 2023-03-22 DIAGNOSIS — C61 Malignant neoplasm of prostate: Secondary | ICD-10-CM | POA: Diagnosis not present

## 2023-03-29 DIAGNOSIS — R3915 Urgency of urination: Secondary | ICD-10-CM | POA: Diagnosis not present

## 2023-03-29 DIAGNOSIS — N5201 Erectile dysfunction due to arterial insufficiency: Secondary | ICD-10-CM | POA: Diagnosis not present

## 2023-03-29 DIAGNOSIS — N401 Enlarged prostate with lower urinary tract symptoms: Secondary | ICD-10-CM | POA: Diagnosis not present

## 2023-04-18 DIAGNOSIS — I5189 Other ill-defined heart diseases: Secondary | ICD-10-CM | POA: Diagnosis not present

## 2023-04-18 DIAGNOSIS — R001 Bradycardia, unspecified: Secondary | ICD-10-CM | POA: Diagnosis not present

## 2023-04-18 DIAGNOSIS — D692 Other nonthrombocytopenic purpura: Secondary | ICD-10-CM | POA: Diagnosis not present

## 2023-04-18 DIAGNOSIS — N1831 Chronic kidney disease, stage 3a: Secondary | ICD-10-CM | POA: Diagnosis not present

## 2023-04-18 DIAGNOSIS — Z23 Encounter for immunization: Secondary | ICD-10-CM | POA: Diagnosis not present

## 2023-04-18 DIAGNOSIS — R0602 Shortness of breath: Secondary | ICD-10-CM | POA: Diagnosis not present

## 2023-04-18 DIAGNOSIS — R5383 Other fatigue: Secondary | ICD-10-CM | POA: Diagnosis not present

## 2023-04-18 DIAGNOSIS — M545 Low back pain, unspecified: Secondary | ICD-10-CM | POA: Diagnosis not present

## 2023-04-18 DIAGNOSIS — I129 Hypertensive chronic kidney disease with stage 1 through stage 4 chronic kidney disease, or unspecified chronic kidney disease: Secondary | ICD-10-CM | POA: Diagnosis not present

## 2023-04-18 DIAGNOSIS — I77810 Thoracic aortic ectasia: Secondary | ICD-10-CM | POA: Diagnosis not present

## 2023-04-18 DIAGNOSIS — R972 Elevated prostate specific antigen [PSA]: Secondary | ICD-10-CM | POA: Diagnosis not present

## 2023-05-02 DIAGNOSIS — M48062 Spinal stenosis, lumbar region with neurogenic claudication: Secondary | ICD-10-CM | POA: Diagnosis not present

## 2023-06-13 DIAGNOSIS — M48062 Spinal stenosis, lumbar region with neurogenic claudication: Secondary | ICD-10-CM | POA: Diagnosis not present

## 2023-07-13 ENCOUNTER — Encounter: Payer: Self-pay | Admitting: Cardiovascular Disease

## 2023-07-13 ENCOUNTER — Ambulatory Visit: Payer: Medicare HMO | Attending: Cardiovascular Disease | Admitting: Cardiovascular Disease

## 2023-07-13 VITALS — BP 142/60 | HR 56 | Ht 68.0 in | Wt 209.0 lb

## 2023-07-13 DIAGNOSIS — I700 Atherosclerosis of aorta: Secondary | ICD-10-CM

## 2023-07-13 DIAGNOSIS — I10 Essential (primary) hypertension: Secondary | ICD-10-CM | POA: Diagnosis not present

## 2023-07-13 NOTE — Patient Instructions (Signed)
Medication Instructions:  Your physician recommends that you continue on your current medications as directed. Please refer to the Current Medication list given to you today.  *If you need a refill on your cardiac medications before your next appointment, please call your pharmacy*   Lab Work: NONE If you have labs (blood work) drawn today and your tests are completely normal, you will receive your results only by: MyChart Message (if you have MyChart) OR A paper copy in the mail If you have any lab test that is abnormal or we need to change your treatment, we will call you to review the results.   Testing/Procedures: NONE   Follow-Up: At [facility], you and your health needs are our priority.  As part of our continuing mission to provide you with exceptional heart care, we have created designated Provider Care Teams.  These Care Teams include your primary Cardiologist (physician) and Advanced Practice Providers (APPs -  Physician Assistants and Nurse Practitioners) who all work together to provide you with the care you need, when you need it.  We recommend signing up for the patient portal called "MyChart".  Sign up information is provided on this After Visit Summary.  MyChart is used to connect with patients for Virtual Visits (Telemedicine).  Patients are able to view lab/test results, encounter notes, upcoming appointments, etc.  Non-urgent messages can be sent to your provider as well.   To learn more about what you can do with MyChart, go to [email or web address].    Your next appointment:   1 year(s)  Provider:   Michael Cooper, MD      

## 2023-07-13 NOTE — Progress Notes (Signed)
Cardiology Office Note:    Date:  07/13/2023   ID:  Jonathan Becker, DOB Jun 26, 1930, MRN [id number]  PCP:  Creola Corn, MD   [facility] Providers Cardiologist:  Tonny Bollman, MD     Referring MD: Creola Corn, MD   No chief complaint on file.   History of Present Illness:    Jonathan Becker is a 89 y.o. male presenting for follow-up of hypertension.  He reports no interval problems since his visit last year. At the time of last year's visit, the patient reported feeling like "a zombie" when his blood pressure was running low.  He reported some low blood pressure readings postexercise.  His carvedilol was discontinued.  He really has not appreciated any significant change since then.  He still has generalized fatigue.  States that he is primarily limited by low back problems.  He denies any chest pain, shortness of breath, heart palpitations, or edema.  Past Medical History:  Diagnosis Date   Essential hypertension 02/18/2019   Heart murmur    was told had slight heart murmur years ago   Hypertension    Shortness of breath 02/18/2019    Past Surgical History:  Procedure Laterality Date   EYE SURGERY     bilateral cataract surgery with lens implant   TONSILLECTOMY     age 48   UMBILICAL HERNIA REPAIR N/A 11/20/2017   Procedure: OPEN HERNIA REPAIR UMBILICAL ADULT;  Surgeon: Berna Bue, MD;  Location: [facility];  Service: General;  Laterality: N/A;  Gen - LMA   VASECTOMY     WISDOM TOOTH EXTRACTION      Current Medications: Current Meds  Medication Sig   amLODipine (NORVASC) 10 MG tablet Take 10 mg by mouth daily.    aspirin EC 81 MG tablet Take 81 mg by mouth daily.   Calcium Citrate 250 MG TABS Take 1 tablet by mouth every evening.   cholecalciferol (VITAMIN D) 1000 units tablet Take 1,000 Units by mouth daily.   hydrochlorothiazide (HYDRODIURIL) 25 MG tablet Take 25 mg by mouth daily. Per patient taking 1/2 tablet daily   irbesartan (AVAPRO) 150 MG  tablet Take 300 mg by mouth daily.   magnesium gluconate (MAGONATE) 500 MG tablet Take 500 mg by mouth daily.   Multiple Vitamins-Minerals (ICAPS AREDS 2) CAPS Take 1 capsule by mouth 2 (two) times daily.   Multiple Vitamins-Minerals (MULTIVITAMIN WITH MINERALS) tablet Take 1 tablet by mouth daily.   Omega-3 1000 MG CAPS Take 1 capsule by mouth every evening.   tamsulosin (FLOMAX) 0.4 MG CAPS capsule Take 0.4 mg by mouth every other day.     Allergies:   Clindamycin/lincomycin and Sulfa antibiotics   Social History   Socioeconomic History   Marital status: Married    Spouse name: Not on file   Number of children: Not on file   Years of education: Not on file   Highest education level: Not on file  Occupational History   Not on file  Tobacco Use   Smoking status: Never   Smokeless tobacco: Never  Vaping Use   Vaping status: Never Used  Substance and Sexual Activity   Alcohol use: Yes    Alcohol/week: 0.0 standard drinks of alcohol    Comment: 1 glass liquor and 1 glass wine nightly   Drug use: No   Sexual activity: Not on file  Other Topics Concern   Not on file  Social History Narrative   Not on file   Social Determinants  of Health   Financial Resource Strain: Not on file  Food Insecurity: Not on file  Transportation Needs: Not on file  Physical Activity: Not on file  Stress: Not on file  Social Connections: Not on file     Family History: The patient's family history includes Heart attack in his father.  ROS:   Please see the history of present illness.    All other systems reviewed and are negative.  EKGs/Labs/Other Studies Reviewed:         Recent Labs: No results found for requested labs within last 365 days.  Recent Lipid Panel No results found for: "CHOL", "TRIG", "HDL", "CHOLHDL", "VLDL", "LDLCALC", "LDLDIRECT"       Physical Exam:    VS:  BP (!) 142/60   Pulse (!) 56   Ht 5\' 8"  (1.727 m)   Wt 209 lb (94.8 kg)   SpO2 93%   BMI 31.78 kg/m      Wt Readings from Last 3 Encounters:  07/13/23 209 lb (94.8 kg)  06/29/22 202 lb (91.6 kg)  05/12/21 205 lb 9.6 oz (93.3 kg)     GEN:  Well nourished, well developed pleasant elderly male in no acute distress HEENT: Normal NECK: No JVD; No carotid bruits LYMPHATICS: No lymphadenopathy CARDIAC: RRR, no murmurs, rubs, gallops RESPIRATORY:  Clear to auscultation without rales, wheezing or rhonchi  ABDOMEN: Soft, non-tender, non-distended MUSCULOSKELETAL:  No edema; No deformity  SKIN: Warm and dry NEUROLOGIC:  Alert and oriented x 3 PSYCHIATRIC:  Normal affect   ASSESSMENT:    1. Essential hypertension   2. Aortic atherosclerosis (HCC)    PLAN:    In order of problems listed above:  Blood pressure is controlled on a combination of amlodipine, hydrochlorothiazide, and irbesartan.  He will remain off carvedilol.  No changes are made today.  Last labs are reviewed with creatinine of 1.4 and potassium of 4.3.  There is some subtle changes in his EKG that I do not think are clinically significant. He continues on aspirin for antiplatelet therapy.     Medication Adjustments/Labs and Tests Ordered: Current medicines are reviewed at length with the patient today.  Concerns regarding medicines are outlined above.  Orders Placed This Encounter  Procedures   EKG 12-Lead   No orders of the defined types were placed in this encounter.   There are no Patient Instructions on file for this visit.   Signed, Tonny Bollman, MD  07/13/2023 11:55 AM    [facility]

## 2023-08-17 DIAGNOSIS — H353132 Nonexudative age-related macular degeneration, bilateral, intermediate dry stage: Secondary | ICD-10-CM | POA: Diagnosis not present

## 2023-08-28 DIAGNOSIS — M48062 Spinal stenosis, lumbar region with neurogenic claudication: Secondary | ICD-10-CM | POA: Diagnosis not present

## 2023-09-05 DIAGNOSIS — L570 Actinic keratosis: Secondary | ICD-10-CM | POA: Diagnosis not present

## 2023-09-05 DIAGNOSIS — L821 Other seborrheic keratosis: Secondary | ICD-10-CM | POA: Diagnosis not present

## 2023-09-05 DIAGNOSIS — I872 Venous insufficiency (chronic) (peripheral): Secondary | ICD-10-CM | POA: Diagnosis not present

## 2023-09-05 DIAGNOSIS — I8312 Varicose veins of left lower extremity with inflammation: Secondary | ICD-10-CM | POA: Diagnosis not present

## 2023-09-05 DIAGNOSIS — Z85828 Personal history of other malignant neoplasm of skin: Secondary | ICD-10-CM | POA: Diagnosis not present

## 2023-09-05 DIAGNOSIS — D0462 Carcinoma in situ of skin of left upper limb, including shoulder: Secondary | ICD-10-CM | POA: Diagnosis not present

## 2023-09-05 DIAGNOSIS — L720 Epidermal cyst: Secondary | ICD-10-CM | POA: Diagnosis not present

## 2023-09-05 DIAGNOSIS — L308 Other specified dermatitis: Secondary | ICD-10-CM | POA: Diagnosis not present

## 2023-09-05 DIAGNOSIS — I8311 Varicose veins of right lower extremity with inflammation: Secondary | ICD-10-CM | POA: Diagnosis not present

## 2023-09-05 DIAGNOSIS — L438 Other lichen planus: Secondary | ICD-10-CM | POA: Diagnosis not present

## 2023-09-14 DIAGNOSIS — M48062 Spinal stenosis, lumbar region with neurogenic claudication: Secondary | ICD-10-CM | POA: Diagnosis not present

## 2023-09-27 DIAGNOSIS — N401 Enlarged prostate with lower urinary tract symptoms: Secondary | ICD-10-CM | POA: Diagnosis not present

## 2023-09-27 DIAGNOSIS — R351 Nocturia: Secondary | ICD-10-CM | POA: Diagnosis not present

## 2023-09-27 DIAGNOSIS — N5201 Erectile dysfunction due to arterial insufficiency: Secondary | ICD-10-CM | POA: Diagnosis not present

## 2023-09-27 DIAGNOSIS — R3915 Urgency of urination: Secondary | ICD-10-CM | POA: Diagnosis not present

## 2023-09-27 DIAGNOSIS — C61 Malignant neoplasm of prostate: Secondary | ICD-10-CM | POA: Diagnosis not present

## 2023-10-10 DIAGNOSIS — R82998 Other abnormal findings in urine: Secondary | ICD-10-CM | POA: Diagnosis not present

## 2023-10-10 DIAGNOSIS — E785 Hyperlipidemia, unspecified: Secondary | ICD-10-CM | POA: Diagnosis not present

## 2023-10-13 DIAGNOSIS — M48062 Spinal stenosis, lumbar region with neurogenic claudication: Secondary | ICD-10-CM | POA: Diagnosis not present

## 2023-10-17 DIAGNOSIS — N1831 Chronic kidney disease, stage 3a: Secondary | ICD-10-CM | POA: Diagnosis not present

## 2023-10-17 DIAGNOSIS — I1310 Hypertensive heart and chronic kidney disease without heart failure, with stage 1 through stage 4 chronic kidney disease, or unspecified chronic kidney disease: Secondary | ICD-10-CM | POA: Diagnosis not present

## 2023-10-17 DIAGNOSIS — R0602 Shortness of breath: Secondary | ICD-10-CM | POA: Diagnosis not present

## 2023-10-17 DIAGNOSIS — K5730 Diverticulosis of large intestine without perforation or abscess without bleeding: Secondary | ICD-10-CM | POA: Diagnosis not present

## 2023-10-17 DIAGNOSIS — D692 Other nonthrombocytopenic purpura: Secondary | ICD-10-CM | POA: Diagnosis not present

## 2023-10-17 DIAGNOSIS — E785 Hyperlipidemia, unspecified: Secondary | ICD-10-CM | POA: Diagnosis not present

## 2023-10-17 DIAGNOSIS — R600 Localized edema: Secondary | ICD-10-CM | POA: Diagnosis not present

## 2023-10-17 DIAGNOSIS — Z1339 Encounter for screening examination for other mental health and behavioral disorders: Secondary | ICD-10-CM | POA: Diagnosis not present

## 2023-10-17 DIAGNOSIS — I77810 Thoracic aortic ectasia: Secondary | ICD-10-CM | POA: Diagnosis not present

## 2023-10-17 DIAGNOSIS — Z0000 Encounter for general adult medical examination without abnormal findings: Secondary | ICD-10-CM | POA: Diagnosis not present

## 2023-10-17 DIAGNOSIS — Z1331 Encounter for screening for depression: Secondary | ICD-10-CM | POA: Diagnosis not present

## 2023-10-17 DIAGNOSIS — M545 Low back pain, unspecified: Secondary | ICD-10-CM | POA: Diagnosis not present

## 2023-10-17 DIAGNOSIS — H3530 Unspecified macular degeneration: Secondary | ICD-10-CM | POA: Diagnosis not present

## 2023-11-08 DIAGNOSIS — J029 Acute pharyngitis, unspecified: Secondary | ICD-10-CM | POA: Diagnosis not present

## 2023-11-08 DIAGNOSIS — I129 Hypertensive chronic kidney disease with stage 1 through stage 4 chronic kidney disease, or unspecified chronic kidney disease: Secondary | ICD-10-CM | POA: Diagnosis not present

## 2023-11-08 DIAGNOSIS — J189 Pneumonia, unspecified organism: Secondary | ICD-10-CM | POA: Diagnosis not present

## 2023-11-08 DIAGNOSIS — J09X2 Influenza due to identified novel influenza A virus with other respiratory manifestations: Secondary | ICD-10-CM | POA: Diagnosis not present

## 2023-11-08 DIAGNOSIS — N1831 Chronic kidney disease, stage 3a: Secondary | ICD-10-CM | POA: Diagnosis not present

## 2023-11-08 DIAGNOSIS — Z1152 Encounter for screening for COVID-19: Secondary | ICD-10-CM | POA: Diagnosis not present

## 2023-11-08 DIAGNOSIS — R059 Cough, unspecified: Secondary | ICD-10-CM | POA: Diagnosis not present

## 2023-11-08 DIAGNOSIS — R0981 Nasal congestion: Secondary | ICD-10-CM | POA: Diagnosis not present

## 2023-11-23 DIAGNOSIS — J09X2 Influenza due to identified novel influenza A virus with other respiratory manifestations: Secondary | ICD-10-CM | POA: Diagnosis not present

## 2023-11-23 DIAGNOSIS — N1831 Chronic kidney disease, stage 3a: Secondary | ICD-10-CM | POA: Diagnosis not present

## 2023-11-23 DIAGNOSIS — J189 Pneumonia, unspecified organism: Secondary | ICD-10-CM | POA: Diagnosis not present

## 2023-11-23 DIAGNOSIS — I129 Hypertensive chronic kidney disease with stage 1 through stage 4 chronic kidney disease, or unspecified chronic kidney disease: Secondary | ICD-10-CM | POA: Diagnosis not present

## 2023-11-23 DIAGNOSIS — R059 Cough, unspecified: Secondary | ICD-10-CM | POA: Diagnosis not present

## 2024-02-13 DIAGNOSIS — Z1152 Encounter for screening for COVID-19: Secondary | ICD-10-CM | POA: Diagnosis not present

## 2024-02-13 DIAGNOSIS — E871 Hypo-osmolality and hyponatremia: Secondary | ICD-10-CM | POA: Diagnosis not present

## 2024-02-13 DIAGNOSIS — R509 Fever, unspecified: Secondary | ICD-10-CM | POA: Diagnosis not present

## 2024-02-13 DIAGNOSIS — J302 Other seasonal allergic rhinitis: Secondary | ICD-10-CM | POA: Diagnosis not present

## 2024-02-13 DIAGNOSIS — J40 Bronchitis, not specified as acute or chronic: Secondary | ICD-10-CM | POA: Diagnosis not present

## 2024-02-13 DIAGNOSIS — R0789 Other chest pain: Secondary | ICD-10-CM | POA: Diagnosis not present

## 2024-02-13 DIAGNOSIS — R0989 Other specified symptoms and signs involving the circulatory and respiratory systems: Secondary | ICD-10-CM | POA: Diagnosis not present

## 2024-02-13 DIAGNOSIS — R051 Acute cough: Secondary | ICD-10-CM | POA: Diagnosis not present

## 2024-02-16 DIAGNOSIS — R509 Fever, unspecified: Secondary | ICD-10-CM | POA: Diagnosis not present

## 2024-02-16 DIAGNOSIS — J40 Bronchitis, not specified as acute or chronic: Secondary | ICD-10-CM | POA: Diagnosis not present

## 2024-02-16 DIAGNOSIS — R051 Acute cough: Secondary | ICD-10-CM | POA: Diagnosis not present

## 2024-02-16 DIAGNOSIS — J302 Other seasonal allergic rhinitis: Secondary | ICD-10-CM | POA: Diagnosis not present

## 2024-02-16 DIAGNOSIS — I129 Hypertensive chronic kidney disease with stage 1 through stage 4 chronic kidney disease, or unspecified chronic kidney disease: Secondary | ICD-10-CM | POA: Diagnosis not present

## 2024-02-16 DIAGNOSIS — E871 Hypo-osmolality and hyponatremia: Secondary | ICD-10-CM | POA: Diagnosis not present

## 2024-02-20 DIAGNOSIS — H353131 Nonexudative age-related macular degeneration, bilateral, early dry stage: Secondary | ICD-10-CM | POA: Diagnosis not present

## 2024-02-26 DIAGNOSIS — I129 Hypertensive chronic kidney disease with stage 1 through stage 4 chronic kidney disease, or unspecified chronic kidney disease: Secondary | ICD-10-CM | POA: Diagnosis not present

## 2024-02-26 DIAGNOSIS — N1831 Chronic kidney disease, stage 3a: Secondary | ICD-10-CM | POA: Diagnosis not present

## 2024-03-05 DIAGNOSIS — I872 Venous insufficiency (chronic) (peripheral): Secondary | ICD-10-CM | POA: Diagnosis not present

## 2024-03-05 DIAGNOSIS — L308 Other specified dermatitis: Secondary | ICD-10-CM | POA: Diagnosis not present

## 2024-03-05 DIAGNOSIS — L821 Other seborrheic keratosis: Secondary | ICD-10-CM | POA: Diagnosis not present

## 2024-03-05 DIAGNOSIS — L820 Inflamed seborrheic keratosis: Secondary | ICD-10-CM | POA: Diagnosis not present

## 2024-03-05 DIAGNOSIS — L720 Epidermal cyst: Secondary | ICD-10-CM | POA: Diagnosis not present

## 2024-03-05 DIAGNOSIS — Z85828 Personal history of other malignant neoplasm of skin: Secondary | ICD-10-CM | POA: Diagnosis not present

## 2024-03-05 DIAGNOSIS — L814 Other melanin hyperpigmentation: Secondary | ICD-10-CM | POA: Diagnosis not present

## 2024-03-05 DIAGNOSIS — D692 Other nonthrombocytopenic purpura: Secondary | ICD-10-CM | POA: Diagnosis not present

## 2024-03-05 DIAGNOSIS — D1801 Hemangioma of skin and subcutaneous tissue: Secondary | ICD-10-CM | POA: Diagnosis not present

## 2024-03-05 DIAGNOSIS — I8312 Varicose veins of left lower extremity with inflammation: Secondary | ICD-10-CM | POA: Diagnosis not present

## 2024-03-05 DIAGNOSIS — L570 Actinic keratosis: Secondary | ICD-10-CM | POA: Diagnosis not present

## 2024-03-05 DIAGNOSIS — I8311 Varicose veins of right lower extremity with inflammation: Secondary | ICD-10-CM | POA: Diagnosis not present

## 2024-03-11 DIAGNOSIS — N1831 Chronic kidney disease, stage 3a: Secondary | ICD-10-CM | POA: Diagnosis not present

## 2024-03-11 DIAGNOSIS — I129 Hypertensive chronic kidney disease with stage 1 through stage 4 chronic kidney disease, or unspecified chronic kidney disease: Secondary | ICD-10-CM | POA: Diagnosis not present

## 2024-03-11 DIAGNOSIS — Z79899 Other long term (current) drug therapy: Secondary | ICD-10-CM | POA: Diagnosis not present

## 2024-04-16 DIAGNOSIS — R0602 Shortness of breath: Secondary | ICD-10-CM | POA: Diagnosis not present

## 2024-04-16 DIAGNOSIS — I1310 Hypertensive heart and chronic kidney disease without heart failure, with stage 1 through stage 4 chronic kidney disease, or unspecified chronic kidney disease: Secondary | ICD-10-CM | POA: Diagnosis not present

## 2024-04-16 DIAGNOSIS — N1831 Chronic kidney disease, stage 3a: Secondary | ICD-10-CM | POA: Diagnosis not present

## 2024-06-03 DIAGNOSIS — N1831 Chronic kidney disease, stage 3a: Secondary | ICD-10-CM | POA: Diagnosis not present

## 2024-06-03 DIAGNOSIS — E871 Hypo-osmolality and hyponatremia: Secondary | ICD-10-CM | POA: Diagnosis not present

## 2024-06-25 DIAGNOSIS — E871 Hypo-osmolality and hyponatremia: Secondary | ICD-10-CM | POA: Diagnosis not present

## 2024-07-02 DIAGNOSIS — M48062 Spinal stenosis, lumbar region with neurogenic claudication: Secondary | ICD-10-CM | POA: Diagnosis not present

## 2024-09-03 ENCOUNTER — Ambulatory Visit: Admitting: Cardiovascular Disease

## 2024-09-09 ENCOUNTER — Encounter: Payer: Self-pay | Admitting: Cardiovascular Disease

## 2024-09-09 ENCOUNTER — Ambulatory Visit: Attending: Cardiovascular Disease | Admitting: Cardiovascular Disease

## 2024-09-09 VITALS — BP 160/70 | HR 51 | Ht 68.0 in | Wt 202.0 lb

## 2024-09-09 DIAGNOSIS — I10 Essential (primary) hypertension: Secondary | ICD-10-CM | POA: Diagnosis not present

## 2024-09-09 DIAGNOSIS — R0602 Shortness of breath: Secondary | ICD-10-CM | POA: Diagnosis not present

## 2024-09-09 NOTE — Progress Notes (Unsigned)
 Cardiology Office Note:    Date:  09/09/2024   ID:  Jonathan Becker, DOB Nov 10, 1929, MRN [id number]  PCP:  Jonathan Rush, MD   [facility] Providers Cardiologist:  Jonathan Fell, MD     Referring MD: Jonathan Rush, MD   Chief Complaint  Patient presents with   Hypertension    History of Present Illness:    Jonathan Becker is a 89 y.o. male with a hx of hypertension, presenting for follow-up evaluation.  He has been treated with amlodipine, hydrochlorothiazide, and irbesartan.  His carvedilol was discontinued due to marked fatigue.  The patient is here alone today.  He continues to complain of fatigue and shortness of breath with activity.  States that he feels short of breath with low-level activities.  He has mild leg swelling with no significant change over the past year.  Note that he is on chronic amlodipine for blood pressure.  He wears some sort of compression sock most of the time.  He has no orthopnea or PND.  He has no chest pain or pressure with exertion.  No cough.  He still gets out and exercises several days per week.  He reports that his blood pressures are best after exercise.  Generally runs blood pressure in the 130/70 mmHg range after exercise but otherwise it tends to run 140 to 160 mmHg over 70s.   Current Medications: Current Meds  Medication Sig   alfuzosin (UROXATRAL) 10 MG 24 hr tablet Take 10 mg by mouth daily.   amLODipine (NORVASC) 10 MG tablet Take 10 mg by mouth daily.    aspirin EC 81 MG tablet Take 81 mg by mouth daily.   Calcium Citrate 250 MG TABS Take 1 tablet by mouth every evening.   cholecalciferol (VITAMIN D) 1000 units tablet Take 1,000 Units by mouth daily.   hydrochlorothiazide (HYDRODIURIL) 25 MG tablet Take 25 mg by mouth daily. Per patient taking 1/2 tablet daily   irbesartan (AVAPRO) 150 MG tablet Take 300 mg by mouth daily.   magnesium gluconate (MAGONATE) 500 MG tablet Take 500 mg by mouth daily.   Multiple Vitamins-Minerals  (ICAPS AREDS 2) CAPS Take 1 capsule by mouth 2 (two) times daily.   Multiple Vitamins-Minerals (MULTIVITAMIN WITH MINERALS) tablet Take 1 tablet by mouth daily.   Omega-3 1000 MG CAPS Take 1 capsule by mouth every evening.   [DISCONTINUED] tamsulosin (FLOMAX) 0.4 MG CAPS capsule Take 0.4 mg by mouth every other day. (Patient taking differently: Take 0.4 mg by mouth daily.)     Allergies:   Clindamycin/lincomycin and Sulfa antibiotics   ROS:   Please see the history of present illness.    All other systems reviewed and are negative.  EKGs/Labs/Other Studies Reviewed:    The following studies were reviewed today: Cardiac Studies & Procedures   ______________________________________________________________________________________________   STRESS TESTS  MYOCARDIAL PERFUSION IMAGING 02/22/2019  Interpretation Summary  Nuclear stress EF: 66%. The left ventricular ejection fraction is hyperdynamic (>65%).  There was no ST segment deviation noted during stress.  This is a low risk study. No evidence of ischemia or previous infarction  The study is normal.   ECHOCARDIOGRAM  ECHOCARDIOGRAM COMPLETE 02/22/2019  Narrative ECHOCARDIOGRAM REPORT    Patient Name:   Jonathan Becker Date of Exam: 02/22/2019 Medical Rec #:  [id number]         Height:       70.0 in Accession #:    [id number]        Weight:  195.0 lb Date of Birth:  1930-04-19         BSA:          2.06 m Patient Age:    89 years          BP:           150/75 mmHg Patient Gender: M                 HR:           51 bpm. Exam Location:  [address]   Procedure: 2D Echo, Cardiac Doppler and Color Doppler  Indications:    R06.02 Shortness of breath  History:        Patient has no prior history of Echocardiogram examinations. Signs/Symptoms: Murmur and Shortness of Breath Risk Factors: Hypertension.  Sonographer:    Jonathan Becker RDCS Referring Phys: [phone] Jonathan Becker  IMPRESSIONS   1. The left  ventricle has hyperdynamic systolic function, with an ejection fraction of >65%. The cavity size was normal. There is moderately increased left ventricular wall thickness. Left ventricular diastolic Doppler parameters are consistent with impaired relaxation. Elevated left ventricular end-diastolic pressure The E/e' is 84.1. 2. The right ventricle has normal systolic function. The cavity was normal. There is no increase in right ventricular wall thickness. Right ventricular systolic pressure could not be assessed. 3. Mild calcification of the aortic valve. Aortic valve regurgitation is trivial by color flow Doppler. No aortic valve stenosis. 4. No interatrial shunt detected. 5. The aortic root and ascending aorta are normal in size and structure.  FINDINGS Left Ventricle: The left ventricle has hyperdynamic systolic function, with an ejection fraction of >65%. The cavity size was normal. There is moderately increased left ventricular wall thickness. Left ventricular diastolic Doppler parameters are consistent with impaired relaxation. Elevated left ventricular end-diastolic pressure The E/e' is 84.1.  Right Ventricle: The right ventricle has normal systolic function. The cavity was normal. There is no increase in right ventricular wall thickness. Right ventricular systolic pressure could not be assessed.  Left Atrium: Left atrial size was normal in size.  Right Atrium: Right atrial size was normal in size. Right atrial pressure is estimated at 3 mmHg.  Interatrial Septum: No atrial level shunt detected by color flow Doppler.  Pericardium: There is no evidence of pericardial effusion.  Mitral Valve: The mitral valve is normal in structure. Mitral valve regurgitation is trivial by color flow Doppler.  Tricuspid Valve: The tricuspid valve is normal in structure. Tricuspid valve regurgitation is trivial by color flow Doppler.  Aortic Valve: The aortic valve is normal in structure. Mild  calcification of the aortic valve. Aortic valve regurgitation is trivial by color flow Doppler. There is no evidence of aortic valve stenosis.  Pulmonic Valve: The pulmonic valve was normal in structure. Pulmonic valve regurgitation is trivial by color flow Doppler.  Aorta: The aortic root and ascending aorta are normal in size and structure.  Venous: The inferior vena cava measures 1.40 cm, is normal in size with greater than 50% respiratory variability.   +--------------+--------++ LEFT VENTRICLE         +----------------+----------++ +--------------+--------++ Diastology                 PLAX 2D                +----------------+----------++ +--------------+--------++ LV e' lateral:  8.92 cm/s  LVIDd:        4.10 cm  +----------------+----------++ +--------------+--------++ LV E/e' lateral:10.2  LVIDs:        2.70 cm  +----------------+----------++ +--------------+--------++ LV e' medial:   62.46 cm/s LV PW:        1.20 cm  +----------------+----------++ +--------------+--------++ LV E/e' medial: 1.5        LV IVS:       1.40 cm  +----------------+----------++ +--------------+--------++ LVOT diam:    1.80 cm  +--------------+--------++ LV SV:        47 ml    +--------------+--------++ LV SV Index:  22.40    +--------------+--------++ LVOT Area:    2.54 cm +--------------+--------++                        +--------------+--------++  +---------------+----------++ RIGHT VENTRICLE           +---------------+----------++ RV S prime:    12.20 cm/s +---------------+----------++ TAPSE (M-mode):2.7 cm     +---------------+----------++  +---------------+-------++-----------++ LEFT ATRIUM           Index       +---------------+-------++-----------++ LA diam:       4.20 cm2.03 cm/m  +---------------+-------++-----------++ LA Vol (A2C):  53.6 ml25.96  ml/m +---------------+-------++-----------++ LA Vol (A4C):  56.4 ml27.31 ml/m +---------------+-------++-----------++ LA Biplane Vol:55.6 ml26.93 ml/m +---------------+-------++-----------++ +------------+---------++-----------++ RIGHT ATRIUM         Index       +------------+---------++-----------++ RA Pressure:3.00 mmHg            +------------+---------++-----------++ RA Area:    19.10 cm            +------------+---------++-----------++ RA Volume:  64.10 ml 31.04 ml/m +------------+---------++-----------++ +------------+-----------++ AORTIC VALVE            +------------+-----------++ LVOT Vmax:  106.00 cm/s +------------+-----------++ LVOT Vmean: 68.600 cm/s +------------+-----------++ LVOT VTI:   0.275 m     +------------+-----------++ AR PHT:     570 msec    +------------+-----------++  +-------------+-------++ AORTA                +-------------+-------++ Ao Root diam:3.20 cm +-------------+-------++ Ao Asc diam: 3.40 cm +-------------+-------++  +--------------+--------++    +---------------+---------++ MITRAL VALVE              TRICUSPID VALVE          +--------------+--------++    +---------------+---------++ MV Area (PHT):            Estimated RAP: 3.00 mmHg +--------------+--------++    +---------------+---------++                        +--------------+--------++    +--------------+-------+ MV Decel Time:217 msec    SHUNTS                +--------------+--------++    +--------------+-------+ +--------------+-----------++ Systemic VTI: 0.28 m  MV E velocity:91.30 cm/s  +--------------+-------+ +--------------+-----------++ Systemic Diam:1.80 cm MV A velocity:101.00 cm/s +--------------+-------+ +--------------+-----------++ MV E/A ratio: 0.90        +--------------+-----------++  +---------+-------+ IVC               +---------+-------+ IVC diam:1.40 cm +---------+-------+   Soyla Merck MD Electronically signed by Soyla Merck MD Signature Date/Time: 02/22/2019/10:13:47 PM    Final          ______________________________________________________________________________________________      EKG:   EKG Interpretation Date/Time:  Monday September 09 2024 15:39:01 EST Ventricular Rate:  51 PR Interval:  238 QRS Duration:  92 QT Interval:  492 QTC Calculation: 453 R Axis:   -29  Text Interpretation: Sinus  bradycardia with 1st degree A-V block When compared with ECG of 13-Jul-2023 11:48, PR interval has increased Incomplete right bundle branch block is no longer Present Criteria for Anteroseptal infarct are no longer Present Confirmed by Wonda Sharper [phone]) on 09/09/2024 3:48:08 PM    Recent Labs: No results found for requested labs within last 365 days.  Recent Lipid Panel No results found for: CHOL, TRIG, HDL, CHOLHDL, VLDL, LDLCALC, LDLDIRECT         Physical Exam:    VS:  BP (!) 160/70 (BP Location: Left Arm, Patient Position: Sitting, Cuff Size: Normal)   Pulse (!) 51   Ht 5' 8 (1.727 m)   Wt 202 lb (91.6 kg)   SpO2 99%   BMI 30.71 kg/m     Wt Readings from Last 3 Encounters:  09/09/24 202 lb (91.6 kg)  07/13/23 209 lb (94.8 kg)  06/29/22 202 lb (91.6 kg)     GEN:  Well nourished, well developed in no acute distress HEENT: Normal NECK: No JVD; No carotid bruits LYMPHATICS: No lymphadenopathy CARDIAC: RRR, no murmurs, rubs, gallops RESPIRATORY:  Clear to auscultation without rales, wheezing or rhonchi  ABDOMEN: Soft, non-tender, non-distended MUSCULOSKELETAL:  No edema; No deformity  SKIN: Warm and dry NEUROLOGIC:  Alert and oriented x 3 PSYCHIATRIC:  Normal affect   Assessment & Plan Essential hypertension Treated with amlodipine, hydrochlorothiazide, and irbesartan.  He tells me that even when he was a young man, he could not  tolerate a blood pressure of 120 mmHg due to marked fatigue and weakness.  He cannot take a beta-blocker with a resting heart rate of 50 bpm.  I think our options would be to start him on something like hydralazine or clonidine which I am not in favor of in this elderly gentleman.  Will continue his current medicines.  Will check an echocardiogram to further evaluate his shortness of breath.  His last echocardiogram from 2020 is reviewed today.     Medication Adjustments/Labs and Tests Ordered: Current medicines are reviewed at length with the patient today.  Concerns regarding medicines are outlined above.  Orders Placed This Encounter  Procedures   EKG 12-Lead   No orders of the defined types were placed in this encounter.   There are no Patient Instructions on file for this visit.   Signed, Sharper Wonda, MD  09/09/2024 4:11 PM    [facility]

## 2024-09-09 NOTE — Patient Instructions (Signed)
 Medication Instructions:   No changes *If you need a refill on your cardiac medications before your next appointment, please call your pharmacy*   Lab Work: Not needed    Testing/Procedures:  1)  Your physician has requested that you have an echocardiogram. Echocardiography is a painless test that uses sound waves to create images of your heart. It provides your doctor with information about the size and shape of your heart and how well your heart's chambers and valves are working. This procedure takes approximately one hour. There are no restrictions for this procedure. Please do NOT wear cologne, perfume, aftershave, or lotions (deodorant is allowed). Please arrive 15 minutes prior to your appointment time.  Please note: We ask at that you not bring children with you during ultrasound (echo/ vascular) testing. Due to room size and safety concerns, children are not allowed in the ultrasound rooms during exams. Our front office staff cannot provide observation of children in our lobby area while testing is being conducted. An adult accompanying a patient to their appointment will only be allowed in the ultrasound room at the discretion of the ultrasound technician under special circumstances. We apologize for any inconvenience.   Follow-Up: At [facility], you and your health needs are our priority.  As part of our continuing mission to provide you with exceptional heart care, we have created designated Provider Care Teams.  These Care Teams include your primary Cardiologist (physician) and Advanced Practice Providers (APPs -  Physician Assistants and Nurse Practitioners) who all work together to provide you with the care you need, when you need it.     Your next appointment:   12 month(s)  The format for your next appointment:   In Person  Provider:   Ozell Fell, MD

## 2024-09-09 NOTE — Assessment & Plan Note (Signed)
 Treated with amlodipine, hydrochlorothiazide, and irbesartan.  He tells me that even when he was a young man, he could not tolerate a blood pressure of 120 mmHg due to marked fatigue and weakness.  He cannot take a beta-blocker with a resting heart rate of 50 bpm.  I think our options would be to start him on something like hydralazine or clonidine which I am not in favor of in this elderly gentleman.  Will continue his current medicines.  Will check an echocardiogram to further evaluate his shortness of breath.  His last echocardiogram from 2020 is reviewed today.

## 2024-09-16 DIAGNOSIS — M47816 Spondylosis without myelopathy or radiculopathy, lumbar region: Secondary | ICD-10-CM | POA: Diagnosis not present

## 2024-09-16 DIAGNOSIS — M48061 Spinal stenosis, lumbar region without neurogenic claudication: Secondary | ICD-10-CM | POA: Diagnosis not present

## 2024-09-19 DIAGNOSIS — Z85828 Personal history of other malignant neoplasm of skin: Secondary | ICD-10-CM | POA: Diagnosis not present

## 2024-09-19 DIAGNOSIS — L720 Epidermal cyst: Secondary | ICD-10-CM | POA: Diagnosis not present

## 2024-09-19 DIAGNOSIS — L821 Other seborrheic keratosis: Secondary | ICD-10-CM | POA: Diagnosis not present

## 2024-09-19 DIAGNOSIS — L814 Other melanin hyperpigmentation: Secondary | ICD-10-CM | POA: Diagnosis not present

## 2024-09-19 DIAGNOSIS — L309 Dermatitis, unspecified: Secondary | ICD-10-CM | POA: Diagnosis not present

## 2024-09-24 DIAGNOSIS — M47816 Spondylosis without myelopathy or radiculopathy, lumbar region: Secondary | ICD-10-CM | POA: Diagnosis not present

## 2024-10-22 ENCOUNTER — Ambulatory Visit (HOSPITAL_COMMUNITY)
Admission: RE | Admit: 2024-10-22 | Discharge: 2024-10-22 | Disposition: A | Discharge status: Home / Self Care | Source: Ambulatory Visit | Attending: Cardiology | Admitting: Cardiology

## 2024-10-22 DIAGNOSIS — I10 Essential (primary) hypertension: Secondary | ICD-10-CM | POA: Insufficient documentation

## 2024-10-22 DIAGNOSIS — R0602 Shortness of breath: Secondary | ICD-10-CM | POA: Insufficient documentation

## 2024-10-22 LAB — ECHOCARDIOGRAM COMPLETE
Area-P 1/2: 3.11 cm2
MV VTI: 1.75 cm2
S' Lateral: 3.13 cm

## 2024-10-27 ENCOUNTER — Ambulatory Visit: Payer: Self-pay | Admitting: Cardiovascular Disease
